# Patient Record
Sex: Female | Born: 1940 | Race: White | Hispanic: No | Marital: Married | Smoking: Never smoker
Health system: Southern US, Community
[De-identification: ages and names within clinical notes are randomized; demographics above are authoritative.]

## PROBLEM LIST (undated history)

## (undated) DIAGNOSIS — I10 Essential (primary) hypertension: Secondary | ICD-10-CM

## (undated) DIAGNOSIS — K219 Gastro-esophageal reflux disease without esophagitis: Secondary | ICD-10-CM

## (undated) DIAGNOSIS — M722 Plantar fascial fibromatosis: Secondary | ICD-10-CM

## (undated) DIAGNOSIS — M1990 Unspecified osteoarthritis, unspecified site: Secondary | ICD-10-CM

## (undated) DIAGNOSIS — I639 Cerebral infarction, unspecified: Secondary | ICD-10-CM

## (undated) DIAGNOSIS — E785 Hyperlipidemia, unspecified: Secondary | ICD-10-CM

## (undated) HISTORY — PX: CHOLECYSTECTOMY: SHX55

## (undated) HISTORY — DX: Hyperlipidemia, unspecified: E78.5

## (undated) HISTORY — PX: BREAST SURGERY: SHX581

## (undated) HISTORY — PX: EYE SURGERY: SHX253

---

## 1998-03-16 DIAGNOSIS — I639 Cerebral infarction, unspecified: Secondary | ICD-10-CM

## 1998-03-16 HISTORY — DX: Cerebral infarction, unspecified: I63.9

## 2004-05-30 ENCOUNTER — Emergency Department (HOSPITAL_COMMUNITY): Admission: EM | Admit: 2004-05-30 | Discharge: 2004-05-30 | Payer: Self-pay | Admitting: Family Medicine

## 2006-11-09 ENCOUNTER — Ambulatory Visit: Payer: Self-pay | Admitting: Internal Medicine

## 2006-12-22 ENCOUNTER — Ambulatory Visit: Payer: Self-pay | Admitting: Internal Medicine

## 2007-03-31 ENCOUNTER — Encounter: Admission: RE | Admit: 2007-03-31 | Discharge: 2007-03-31 | Payer: Self-pay | Admitting: Internal Medicine

## 2007-06-16 DIAGNOSIS — I10 Essential (primary) hypertension: Secondary | ICD-10-CM | POA: Insufficient documentation

## 2007-06-16 DIAGNOSIS — F411 Generalized anxiety disorder: Secondary | ICD-10-CM | POA: Insufficient documentation

## 2007-06-16 DIAGNOSIS — M129 Arthropathy, unspecified: Secondary | ICD-10-CM | POA: Insufficient documentation

## 2007-06-16 DIAGNOSIS — K649 Unspecified hemorrhoids: Secondary | ICD-10-CM | POA: Insufficient documentation

## 2007-06-16 DIAGNOSIS — E785 Hyperlipidemia, unspecified: Secondary | ICD-10-CM | POA: Insufficient documentation

## 2007-06-16 DIAGNOSIS — K5730 Diverticulosis of large intestine without perforation or abscess without bleeding: Secondary | ICD-10-CM | POA: Insufficient documentation

## 2007-06-16 DIAGNOSIS — Z8679 Personal history of other diseases of the circulatory system: Secondary | ICD-10-CM | POA: Insufficient documentation

## 2007-11-17 ENCOUNTER — Encounter: Admission: RE | Admit: 2007-11-17 | Discharge: 2007-11-17 | Payer: Self-pay | Admitting: Internal Medicine

## 2009-04-17 DIAGNOSIS — E663 Overweight: Secondary | ICD-10-CM | POA: Insufficient documentation

## 2009-04-17 DIAGNOSIS — N6019 Diffuse cystic mastopathy of unspecified breast: Secondary | ICD-10-CM | POA: Insufficient documentation

## 2009-04-17 DIAGNOSIS — J309 Allergic rhinitis, unspecified: Secondary | ICD-10-CM | POA: Insufficient documentation

## 2009-04-17 DIAGNOSIS — R339 Retention of urine, unspecified: Secondary | ICD-10-CM | POA: Insufficient documentation

## 2009-04-17 DIAGNOSIS — E669 Obesity, unspecified: Secondary | ICD-10-CM | POA: Insufficient documentation

## 2009-11-22 DIAGNOSIS — R209 Unspecified disturbances of skin sensation: Secondary | ICD-10-CM | POA: Insufficient documentation

## 2010-02-19 ENCOUNTER — Telehealth (INDEPENDENT_AMBULATORY_CARE_PROVIDER_SITE_OTHER): Payer: Self-pay | Admitting: *Deleted

## 2010-02-26 ENCOUNTER — Encounter (INDEPENDENT_AMBULATORY_CARE_PROVIDER_SITE_OTHER): Payer: Self-pay | Admitting: Neurology

## 2010-02-26 ENCOUNTER — Ambulatory Visit: Payer: Self-pay

## 2010-02-26 ENCOUNTER — Ambulatory Visit (HOSPITAL_COMMUNITY)
Admission: RE | Admit: 2010-02-26 | Discharge: 2010-02-26 | Payer: Self-pay | Source: Home / Self Care | Attending: Neurology | Admitting: Neurology

## 2010-02-26 ENCOUNTER — Encounter: Payer: Self-pay | Admitting: Cardiology

## 2010-04-15 NOTE — Progress Notes (Signed)
   Records received from [facility] for pt Referral gave to [facility] [place]  February 19, 2010 10:23 AM

## 2010-07-29 NOTE — Assessment & Plan Note (Signed)
[facility]                         GASTROENTEROLOGY OFFICE NOTE   Lisa Porter, Lisa Porter                          MRN:          [id number]  DATE:11/09/2006                            DOB:          1940-07-17    HISTORY OF PRESENT ILLNESS:  Lisa Porter is a very nice 70 year old white  female who is here to discuss colonoscopy.  She is essentially  asymptomatic, having bowel movements on a regular basis.  She had two  flexible sigmoidoscopies while living in [place] in 1991 and 1995, and  one in 2002, both showing diverticulosis.  There is no family history of  colon cancer.  She was also told she had hemorrhoids on her last  sigmoidoscopy.  She denies rectal bleeding.  Recent stool hemoccult  cards by Dr. Timothy Lasso have been returned, but the patient has not heard  from Dr. Timothy Lasso yet.  Another problem has been occasional dysphagia,  especially when she eats bread.  It is quite inconsistent.  She denies  any dysphagia from liquids.  She has not ever had any regurgitation of  her food.  On several occasions, she woke up at 4:00 a.m. with heartburn  which went away after she either took Gas-X or a drink of water.  She  has never taken any acid suppressing agents, and actually denies actual  heartburn during the day.  In 2000, the patient had stroke and was put  on Plavix.  She went off Plavix in 2005 for breast biopsies.   MEDICATIONS:  1. Plavix 75 mg p.o. daily.  2. Aspirin 81 mg p.o. daily.  3. Lipitor 40 mg p.o. daily.  4. Accupril 40 mg p.o. daily.  5. Atretic 20 mg p.o. daily.  6. Multivitamin.  7. Calcium.  8. Potassium.   PAST MEDICAL HISTORY:  1. High blood pressure.  2. Hyperlipidemia.  3. History of CVA.  4. Arthritis.  5. Anxiety.   OPERATIONS:  1. Laparoscopic cholecystectomy in 1990.  2. Breast biopsies for fibrocystic disease x3.   FAMILY HISTORY:  Negative for colon cancer.  Alcoholism in father.  Heart disease in mother.   SOCIAL  HISTORY:  Married with two children.  She worked for [employer].  She  does not smoke and does not drink alcohol.   REVIEW OF SYSTEMS:  Positive for allergies.   PHYSICAL EXAMINATION:  VITAL SIGNS:  Blood pressure 128/80, pulse 80,  weight 193 pounds.  GENERAL:  She is alert, oriented, no acute distress.  LUNGS:  Clear to auscultation.  COR:  Normal S1, S2.  ABDOMEN:  Soft, mildly obese, nontender with normoactive bowel sounds.  Liver edge was at costal margin.  No focal tenderness.  RECTAL:  Not done.  EXTREMITIES:  Trace edema.   IMPRESSION:  53 . A 70 year old white female who is a good candidate for  screening colonoscopy.  She has never had full colonoscopic exam, but  had two previous flexible sigmoidoscopies which showed presence of  hemorrhoids and diverticulosis.  She is at low risk for colon cancer  because of no family history of it and no rectal  bleeding.  1. Occasional dysphagia, may be more suggestive of esophageal      dysmotility.  It seems to be quite infrequent, and it is improved      with posture which suggests that it could be related to      dysmotility.  2. The patient has been anticoagulated with aspirin and Plavix for      prior CVA in 2000.   PLAN:  1. Colonoscopy scheduled this week.  We are going to hold her Plavix      for 7 days prior to the colonoscopy, but continue on the aspirin      since the current guidelines for colonoscopy allow for the patient      to stay on aspirin.  2. I do not think she needs an endoscopy.  Her symptoms are really not      severe enough to warrant endoscopic procedure.  I have discussed      with the patient the colonoscopy prep as well as the conscious      sedation.  If we find a polyp, she will have to stay off Plavix for      additional five days.  If not, she will  be able to return to      Plavix immediately after the procedure.     Hedwig Morton. Juanda Chance, MD  Electronically Signed    DMB/MedQ  DD: 11/09/2006  DT:  11/09/2006  Job #: [id number]   cc:   Gwen Pounds, MD

## 2011-05-20 DIAGNOSIS — I10 Essential (primary) hypertension: Secondary | ICD-10-CM | POA: Diagnosis not present

## 2011-05-20 DIAGNOSIS — J309 Allergic rhinitis, unspecified: Secondary | ICD-10-CM | POA: Diagnosis not present

## 2011-05-20 DIAGNOSIS — R5381 Other malaise: Secondary | ICD-10-CM | POA: Diagnosis not present

## 2011-05-20 DIAGNOSIS — R5383 Other fatigue: Secondary | ICD-10-CM | POA: Insufficient documentation

## 2011-05-20 DIAGNOSIS — E785 Hyperlipidemia, unspecified: Secondary | ICD-10-CM | POA: Diagnosis not present

## 2011-05-25 DIAGNOSIS — M1710 Unilateral primary osteoarthritis, unspecified knee: Secondary | ICD-10-CM | POA: Diagnosis not present

## 2011-06-05 DIAGNOSIS — H348392 Tributary (branch) retinal vein occlusion, unspecified eye, stable: Secondary | ICD-10-CM | POA: Diagnosis not present

## 2011-06-10 DIAGNOSIS — Z1231 Encounter for screening mammogram for malignant neoplasm of breast: Secondary | ICD-10-CM | POA: Diagnosis not present

## 2011-06-23 DIAGNOSIS — M25569 Pain in unspecified knee: Secondary | ICD-10-CM | POA: Diagnosis not present

## 2011-06-23 DIAGNOSIS — M1710 Unilateral primary osteoarthritis, unspecified knee: Secondary | ICD-10-CM | POA: Diagnosis not present

## 2011-08-31 DIAGNOSIS — Z8679 Personal history of other diseases of the circulatory system: Secondary | ICD-10-CM | POA: Diagnosis not present

## 2011-08-31 DIAGNOSIS — R42 Dizziness and giddiness: Secondary | ICD-10-CM | POA: Diagnosis not present

## 2011-09-07 DIAGNOSIS — Z8679 Personal history of other diseases of the circulatory system: Secondary | ICD-10-CM | POA: Diagnosis not present

## 2011-09-07 DIAGNOSIS — R42 Dizziness and giddiness: Secondary | ICD-10-CM | POA: Diagnosis not present

## 2011-11-25 DIAGNOSIS — I10 Essential (primary) hypertension: Secondary | ICD-10-CM | POA: Diagnosis not present

## 2011-11-25 DIAGNOSIS — E785 Hyperlipidemia, unspecified: Secondary | ICD-10-CM | POA: Diagnosis not present

## 2011-12-03 DIAGNOSIS — Z1212 Encounter for screening for malignant neoplasm of rectum: Secondary | ICD-10-CM | POA: Diagnosis not present

## 2011-12-03 DIAGNOSIS — J309 Allergic rhinitis, unspecified: Secondary | ICD-10-CM | POA: Diagnosis not present

## 2011-12-03 DIAGNOSIS — Z23 Encounter for immunization: Secondary | ICD-10-CM | POA: Diagnosis not present

## 2011-12-03 DIAGNOSIS — Z0000 Encounter for general adult medical examination without abnormal findings: Secondary | ICD-10-CM | POA: Diagnosis not present

## 2011-12-03 DIAGNOSIS — I6789 Other cerebrovascular disease: Secondary | ICD-10-CM | POA: Diagnosis not present

## 2011-12-03 DIAGNOSIS — R1319 Other dysphagia: Secondary | ICD-10-CM | POA: Diagnosis not present

## 2012-01-18 DIAGNOSIS — M25569 Pain in unspecified knee: Secondary | ICD-10-CM | POA: Diagnosis not present

## 2012-01-18 DIAGNOSIS — M1710 Unilateral primary osteoarthritis, unspecified knee: Secondary | ICD-10-CM | POA: Diagnosis not present

## 2012-01-18 DIAGNOSIS — M76899 Other specified enthesopathies of unspecified lower limb, excluding foot: Secondary | ICD-10-CM | POA: Diagnosis not present

## 2012-03-02 DIAGNOSIS — R42 Dizziness and giddiness: Secondary | ICD-10-CM | POA: Diagnosis not present

## 2012-04-14 DIAGNOSIS — L821 Other seborrheic keratosis: Secondary | ICD-10-CM | POA: Diagnosis not present

## 2012-04-14 DIAGNOSIS — L570 Actinic keratosis: Secondary | ICD-10-CM | POA: Diagnosis not present

## 2012-05-23 DIAGNOSIS — E785 Hyperlipidemia, unspecified: Secondary | ICD-10-CM | POA: Diagnosis not present

## 2012-05-23 DIAGNOSIS — J309 Allergic rhinitis, unspecified: Secondary | ICD-10-CM | POA: Diagnosis not present

## 2012-05-23 DIAGNOSIS — I10 Essential (primary) hypertension: Secondary | ICD-10-CM | POA: Diagnosis not present

## 2012-05-23 DIAGNOSIS — I6789 Other cerebrovascular disease: Secondary | ICD-10-CM | POA: Diagnosis not present

## 2012-06-08 DIAGNOSIS — H2510 Age-related nuclear cataract, unspecified eye: Secondary | ICD-10-CM | POA: Diagnosis not present

## 2012-06-13 DIAGNOSIS — Z1231 Encounter for screening mammogram for malignant neoplasm of breast: Secondary | ICD-10-CM | POA: Diagnosis not present

## 2012-06-29 DIAGNOSIS — M1710 Unilateral primary osteoarthritis, unspecified knee: Secondary | ICD-10-CM | POA: Diagnosis not present

## 2012-06-29 DIAGNOSIS — M67919 Unspecified disorder of synovium and tendon, unspecified shoulder: Secondary | ICD-10-CM | POA: Diagnosis not present

## 2012-06-29 DIAGNOSIS — M719 Bursopathy, unspecified: Secondary | ICD-10-CM | POA: Diagnosis not present

## 2012-07-18 ENCOUNTER — Ambulatory Visit: Payer: Self-pay | Admitting: Ophthalmology

## 2012-07-18 DIAGNOSIS — Z01812 Encounter for preprocedural laboratory examination: Secondary | ICD-10-CM | POA: Diagnosis not present

## 2012-07-18 DIAGNOSIS — H2510 Age-related nuclear cataract, unspecified eye: Secondary | ICD-10-CM | POA: Diagnosis not present

## 2012-07-18 DIAGNOSIS — Z01810 Encounter for preprocedural cardiovascular examination: Secondary | ICD-10-CM | POA: Diagnosis not present

## 2012-07-18 DIAGNOSIS — I10 Essential (primary) hypertension: Secondary | ICD-10-CM

## 2012-07-18 LAB — POTASSIUM: Potassium: 3.7 mmol/L (ref 3.5–5.1)

## 2012-07-27 DIAGNOSIS — M1710 Unilateral primary osteoarthritis, unspecified knee: Secondary | ICD-10-CM | POA: Diagnosis not present

## 2012-08-01 ENCOUNTER — Ambulatory Visit: Payer: Self-pay | Admitting: Ophthalmology

## 2012-08-01 DIAGNOSIS — I10 Essential (primary) hypertension: Secondary | ICD-10-CM | POA: Diagnosis not present

## 2012-08-01 DIAGNOSIS — H269 Unspecified cataract: Secondary | ICD-10-CM | POA: Diagnosis not present

## 2012-08-01 DIAGNOSIS — Z882 Allergy status to sulfonamides status: Secondary | ICD-10-CM | POA: Diagnosis not present

## 2012-08-01 DIAGNOSIS — M779 Enthesopathy, unspecified: Secondary | ICD-10-CM | POA: Diagnosis not present

## 2012-08-01 DIAGNOSIS — H2510 Age-related nuclear cataract, unspecified eye: Secondary | ICD-10-CM | POA: Diagnosis not present

## 2012-08-01 DIAGNOSIS — Z7901 Long term (current) use of anticoagulants: Secondary | ICD-10-CM | POA: Diagnosis not present

## 2012-08-01 DIAGNOSIS — E7800 Pure hypercholesterolemia, unspecified: Secondary | ICD-10-CM | POA: Diagnosis not present

## 2012-08-01 DIAGNOSIS — Z8673 Personal history of transient ischemic attack (TIA), and cerebral infarction without residual deficits: Secondary | ICD-10-CM | POA: Diagnosis not present

## 2012-08-01 DIAGNOSIS — Z79899 Other long term (current) drug therapy: Secondary | ICD-10-CM | POA: Diagnosis not present

## 2012-08-01 DIAGNOSIS — Z7982 Long term (current) use of aspirin: Secondary | ICD-10-CM | POA: Diagnosis not present

## 2012-11-22 ENCOUNTER — Other Ambulatory Visit (HOSPITAL_COMMUNITY): Payer: Self-pay | Admitting: Orthopaedic Surgery

## 2012-12-02 DIAGNOSIS — I10 Essential (primary) hypertension: Secondary | ICD-10-CM | POA: Diagnosis not present

## 2012-12-02 DIAGNOSIS — E785 Hyperlipidemia, unspecified: Secondary | ICD-10-CM | POA: Diagnosis not present

## 2012-12-12 DIAGNOSIS — J309 Allergic rhinitis, unspecified: Secondary | ICD-10-CM | POA: Diagnosis not present

## 2012-12-12 DIAGNOSIS — Z1212 Encounter for screening for malignant neoplasm of rectum: Secondary | ICD-10-CM | POA: Diagnosis not present

## 2012-12-12 DIAGNOSIS — Z23 Encounter for immunization: Secondary | ICD-10-CM | POA: Diagnosis not present

## 2012-12-12 DIAGNOSIS — R339 Retention of urine, unspecified: Secondary | ICD-10-CM | POA: Diagnosis not present

## 2012-12-12 DIAGNOSIS — M1990 Unspecified osteoarthritis, unspecified site: Secondary | ICD-10-CM | POA: Diagnosis not present

## 2012-12-12 DIAGNOSIS — R1319 Other dysphagia: Secondary | ICD-10-CM | POA: Diagnosis not present

## 2012-12-12 DIAGNOSIS — Z0000 Encounter for general adult medical examination without abnormal findings: Secondary | ICD-10-CM | POA: Diagnosis not present

## 2012-12-12 DIAGNOSIS — Z1331 Encounter for screening for depression: Secondary | ICD-10-CM | POA: Diagnosis not present

## 2012-12-12 DIAGNOSIS — E663 Overweight: Secondary | ICD-10-CM | POA: Diagnosis not present

## 2012-12-12 DIAGNOSIS — R51 Headache: Secondary | ICD-10-CM | POA: Diagnosis not present

## 2012-12-12 DIAGNOSIS — I6789 Other cerebrovascular disease: Secondary | ICD-10-CM | POA: Diagnosis not present

## 2012-12-12 DIAGNOSIS — R739 Hyperglycemia, unspecified: Secondary | ICD-10-CM | POA: Insufficient documentation

## 2012-12-13 DIAGNOSIS — L723 Sebaceous cyst: Secondary | ICD-10-CM | POA: Diagnosis not present

## 2012-12-13 DIAGNOSIS — L821 Other seborrheic keratosis: Secondary | ICD-10-CM | POA: Diagnosis not present

## 2012-12-13 DIAGNOSIS — L570 Actinic keratosis: Secondary | ICD-10-CM | POA: Diagnosis not present

## 2012-12-21 DIAGNOSIS — M1710 Unilateral primary osteoarthritis, unspecified knee: Secondary | ICD-10-CM | POA: Diagnosis not present

## 2013-01-03 DIAGNOSIS — Z23 Encounter for immunization: Secondary | ICD-10-CM | POA: Diagnosis not present

## 2013-01-11 DIAGNOSIS — H2510 Age-related nuclear cataract, unspecified eye: Secondary | ICD-10-CM | POA: Diagnosis not present

## 2013-01-13 ENCOUNTER — Encounter (HOSPITAL_COMMUNITY): Payer: Self-pay | Admitting: Pharmacy Technician

## 2013-01-17 ENCOUNTER — Other Ambulatory Visit (HOSPITAL_COMMUNITY): Payer: Self-pay | Admitting: Orthopaedic Surgery

## 2013-01-17 NOTE — Patient Instructions (Addendum)
20 Lashelle Koy  01/17/2013   Your procedure is scheduled on: 01/27/13  FRIDAY   Report to [facility] at  0515     AM.  Call this number if you have problems the morning of surgery: [phone]       Remember:   Do not eat food  Or drink :After Midnight. Thursday NIGHT   Take these medicines the morning of surgery with A SIP OF WATER:NONE   .  Contacts, dentures or partial plates can not be worn to surgery  Leave suitcase in the car. After surgery it may be brought to your room.  For patients admitted to the hospital, checkout time is 11:00 AM day of  discharge.             SPECIAL INSTRUCTIONS- SEE [place] PREPARING FOR SURGERY INSTRUCTION SHEET-     DO NOT WEAR JEWELRY, LOTIONS, POWDERS, OR PERFUMES.  WOMEN-- DO NOT SHAVE LEGS OR UNDERARMS FOR 12 HOURS BEFORE SHOWERS. MEN MAY SHAVE FACE.  Patients discharged the day of surgery will not be allowed to drive home. IF going home the day of surgery, you must have a driver and someone to stay with you for the first 24 hours  Name and phone number of your driver:   ADMISSION                                                                     Please read over the following fact sheets that you were given: MRSA Information,, Blood Transfusion Sheet  Information                                                                                 I am aware that blood will need to be drawn morning of surgery for type and screen  Zakiah Gauthreaux  PST [phone]                 FAILURE TO FOLLOW THESE INSTRUCTIONS MAY RESULT IN  CANCELLATION   OF YOUR SURGERY                                                  Patient Signature _____________________________

## 2013-01-17 NOTE — Progress Notes (Signed)
EKG 5/14 chart

## 2013-01-18 ENCOUNTER — Encounter (HOSPITAL_COMMUNITY)
Admission: RE | Admit: 2013-01-18 | Discharge: 2013-01-18 | Disposition: A | Discharge status: Home / Self Care | Payer: Medicare Other | Source: Ambulatory Visit | Attending: Orthopaedic Surgery | Admitting: Orthopaedic Surgery

## 2013-01-18 ENCOUNTER — Ambulatory Visit (HOSPITAL_COMMUNITY)
Admission: RE | Admit: 2013-01-18 | Discharge: 2013-01-18 | Disposition: A | Discharge status: Home / Self Care | Payer: Medicare Other | Source: Ambulatory Visit | Attending: Orthopaedic Surgery | Admitting: Orthopaedic Surgery

## 2013-01-18 ENCOUNTER — Encounter (HOSPITAL_COMMUNITY): Payer: Self-pay

## 2013-01-18 DIAGNOSIS — M47814 Spondylosis without myelopathy or radiculopathy, thoracic region: Secondary | ICD-10-CM | POA: Insufficient documentation

## 2013-01-18 DIAGNOSIS — Z01812 Encounter for preprocedural laboratory examination: Secondary | ICD-10-CM | POA: Insufficient documentation

## 2013-01-18 DIAGNOSIS — Z01818 Encounter for other preprocedural examination: Secondary | ICD-10-CM | POA: Diagnosis not present

## 2013-01-18 HISTORY — DX: Cerebral infarction, unspecified: I63.9

## 2013-01-18 HISTORY — DX: Essential (primary) hypertension: I10

## 2013-01-18 HISTORY — DX: Gastro-esophageal reflux disease without esophagitis: K21.9

## 2013-01-18 HISTORY — DX: Plantar fascial fibromatosis: M72.2

## 2013-01-18 HISTORY — DX: Unspecified osteoarthritis, unspecified site: M19.90

## 2013-01-18 LAB — URINALYSIS, ROUTINE W REFLEX MICROSCOPIC
Bilirubin Urine: NEGATIVE
Glucose, UA: NEGATIVE mg/dL
Hgb urine dipstick: NEGATIVE
Ketones, ur: NEGATIVE mg/dL
Protein, ur: NEGATIVE mg/dL
Urobilinogen, UA: 0.2 mg/dL (ref 0.0–1.0)

## 2013-01-18 LAB — PROTIME-INR
INR: 0.94 (ref 0.00–1.49)
Prothrombin Time: 12.4 seconds (ref 11.6–15.2)

## 2013-01-18 LAB — CBC
HCT: 39.2 % (ref 36.0–46.0)
Hemoglobin: 13.5 g/dL (ref 12.0–15.0)
MCH: 32.7 pg (ref 26.0–34.0)
MCHC: 34.4 g/dL (ref 30.0–36.0)
RDW: 12.6 % (ref 11.5–15.5)

## 2013-01-18 LAB — SURGICAL PCR SCREEN
MRSA, PCR: NEGATIVE
Staphylococcus aureus: NEGATIVE

## 2013-01-18 LAB — BASIC METABOLIC PANEL
BUN: 19 mg/dL (ref 6–23)
Chloride: 103 mEq/L (ref 96–112)
Creatinine, Ser: 0.8 mg/dL (ref 0.50–1.10)
GFR calc Af Amer: 83 mL/min — ABNORMAL LOW (ref >90)
Glucose, Bld: 95 mg/dL (ref 70–99)
Potassium: 3.9 mEq/L (ref 3.5–5.1)

## 2013-01-18 LAB — APTT: aPTT: 27 seconds (ref 24–37)

## 2013-01-27 ENCOUNTER — Inpatient Hospital Stay (HOSPITAL_COMMUNITY): Payer: Medicare Other | Admitting: Anesthesiology

## 2013-01-27 ENCOUNTER — Inpatient Hospital Stay (HOSPITAL_COMMUNITY): Payer: Medicare Other

## 2013-01-27 ENCOUNTER — Encounter (HOSPITAL_COMMUNITY): Payer: Medicare Other | Admitting: Anesthesiology

## 2013-01-27 ENCOUNTER — Encounter (HOSPITAL_COMMUNITY): Payer: Self-pay | Admitting: Orthopedic Surgery

## 2013-01-27 ENCOUNTER — Inpatient Hospital Stay (HOSPITAL_COMMUNITY)
Admission: RE | Admit: 2013-01-27 | Discharge: 2013-01-30 | DRG: 470 | Disposition: A | Discharge status: Home Health | Payer: Medicare Other | Source: Ambulatory Visit | Attending: Orthopaedic Surgery | Admitting: Orthopaedic Surgery

## 2013-01-27 ENCOUNTER — Encounter (HOSPITAL_COMMUNITY)
Admission: RE | Disposition: A | Discharge status: Home Health | Payer: Self-pay | Source: Ambulatory Visit | Attending: Orthopaedic Surgery

## 2013-01-27 DIAGNOSIS — Z79899 Other long term (current) drug therapy: Secondary | ICD-10-CM | POA: Diagnosis not present

## 2013-01-27 DIAGNOSIS — I10 Essential (primary) hypertension: Secondary | ICD-10-CM | POA: Diagnosis not present

## 2013-01-27 DIAGNOSIS — M1710 Unilateral primary osteoarthritis, unspecified knee: Secondary | ICD-10-CM | POA: Diagnosis not present

## 2013-01-27 DIAGNOSIS — Z8673 Personal history of transient ischemic attack (TIA), and cerebral infarction without residual deficits: Secondary | ICD-10-CM

## 2013-01-27 DIAGNOSIS — K219 Gastro-esophageal reflux disease without esophagitis: Secondary | ICD-10-CM | POA: Diagnosis present

## 2013-01-27 DIAGNOSIS — M25569 Pain in unspecified knee: Secondary | ICD-10-CM | POA: Diagnosis not present

## 2013-01-27 DIAGNOSIS — M1711 Unilateral primary osteoarthritis, right knee: Secondary | ICD-10-CM

## 2013-01-27 DIAGNOSIS — Z9089 Acquired absence of other organs: Secondary | ICD-10-CM

## 2013-01-27 DIAGNOSIS — Z7982 Long term (current) use of aspirin: Secondary | ICD-10-CM

## 2013-01-27 DIAGNOSIS — Z471 Aftercare following joint replacement surgery: Secondary | ICD-10-CM | POA: Diagnosis not present

## 2013-01-27 DIAGNOSIS — E785 Hyperlipidemia, unspecified: Secondary | ICD-10-CM | POA: Diagnosis not present

## 2013-01-27 DIAGNOSIS — Z881 Allergy status to other antibiotic agents status: Secondary | ICD-10-CM

## 2013-01-27 DIAGNOSIS — Z96659 Presence of unspecified artificial knee joint: Secondary | ICD-10-CM | POA: Diagnosis not present

## 2013-01-27 HISTORY — PX: TOTAL KNEE ARTHROPLASTY: SHX125

## 2013-01-27 LAB — TYPE AND SCREEN: Antibody Screen: NEGATIVE

## 2013-01-27 SURGERY — ARTHROPLASTY, KNEE, TOTAL
Anesthesia: Spinal | Site: Knee | Laterality: Right | Wound class: Clean

## 2013-01-27 MED ORDER — PROMETHAZINE HCL 25 MG/ML IJ SOLN
6.2500 mg | INTRAMUSCULAR | Status: DC | PRN
  Administered 2013-01-27: 6.25 mg via INTRAVENOUS

## 2013-01-27 MED ORDER — ACETAMINOPHEN 325 MG PO TABS
650.0000 mg | ORAL_TABLET | Freq: Four times a day (QID) | ORAL | Status: DC | PRN
  Administered 2013-01-28 – 2013-01-30 (×4): 650 mg via ORAL
  Filled 2013-01-27 (×4): qty 2

## 2013-01-27 MED ORDER — DIPHENHYDRAMINE HCL 12.5 MG/5ML PO ELIX: 12.5000 mg | ORAL_SOLUTION | ORAL | Status: DC | PRN

## 2013-01-27 MED ORDER — OXYCODONE HCL 5 MG PO TABS
5.0000 mg | ORAL_TABLET | ORAL | Status: DC | PRN
  Administered 2013-01-27: 5 mg via ORAL
  Administered 2013-01-27 (×2): 10 mg via ORAL
  Administered 2013-01-27: 5 mg via ORAL
  Administered 2013-01-28 (×3): 10 mg via ORAL
  Filled 2013-01-27: qty 2
  Filled 2013-01-27 (×2): qty 1
  Filled 2013-01-27 (×4): qty 2
  Filled 2013-01-27: qty 1

## 2013-01-27 MED ORDER — HYDROMORPHONE HCL PF 1 MG/ML IJ SOLN
1.0000 mg | INTRAMUSCULAR | Status: DC | PRN
  Administered 2013-01-27 – 2013-01-28 (×4): 1 mg via INTRAVENOUS
  Filled 2013-01-27 (×4): qty 1

## 2013-01-27 MED ORDER — SODIUM CHLORIDE 0.9 % IR SOLN
Status: DC | PRN
  Administered 2013-01-27: 1000 mL

## 2013-01-27 MED ORDER — SODIUM CHLORIDE 0.9 % IV SOLN
INTRAVENOUS | Status: DC
  Administered 2013-01-27: 14:00:00 75 mL/h via INTRAVENOUS
  Administered 2013-01-28: 04:00:00 via INTRAVENOUS

## 2013-01-27 MED ORDER — PROPOFOL 10 MG/ML IV BOLUS
INTRAVENOUS | Status: DC | PRN
  Administered 2013-01-27: 20 mg via INTRAVENOUS

## 2013-01-27 MED ORDER — ASPIRIN EC 81 MG PO TBEC
81.0000 mg | DELAYED_RELEASE_TABLET | Freq: Every day | ORAL | Status: DC
  Administered 2013-01-27 – 2013-01-29 (×3): 81 mg via ORAL
  Filled 2013-01-27 (×4): qty 1

## 2013-01-27 MED ORDER — FENTANYL CITRATE 0.05 MG/ML IJ SOLN: 25.0000 ug | INTRAMUSCULAR | Status: DC | PRN

## 2013-01-27 MED ORDER — ZOLPIDEM TARTRATE 5 MG PO TABS: 5.0000 mg | ORAL_TABLET | Freq: Every evening | ORAL | Status: DC | PRN

## 2013-01-27 MED ORDER — MENTHOL 3 MG MT LOZG: 1.0000 | LOZENGE | OROMUCOSAL | Status: DC | PRN

## 2013-01-27 MED ORDER — ONDANSETRON HCL 4 MG/2ML IJ SOLN
INTRAMUSCULAR | Status: DC | PRN
  Administered 2013-01-27: 4 mg via INTRAVENOUS

## 2013-01-27 MED ORDER — ALUM & MAG HYDROXIDE-SIMETH 200-200-20 MG/5ML PO SUSP: 30.0000 mL | ORAL | Status: DC | PRN

## 2013-01-27 MED ORDER — ONDANSETRON HCL 4 MG PO TABS
4.0000 mg | ORAL_TABLET | Freq: Four times a day (QID) | ORAL | Status: DC | PRN
  Administered 2013-01-30: 4 mg via ORAL
  Filled 2013-01-27: qty 1

## 2013-01-27 MED ORDER — DOCUSATE SODIUM 100 MG PO CAPS
100.0000 mg | ORAL_CAPSULE | Freq: Two times a day (BID) | ORAL | Status: DC
  Administered 2013-01-27 – 2013-01-30 (×6): 100 mg via ORAL

## 2013-01-27 MED ORDER — QUINAPRIL HCL 10 MG PO TABS: 40.0000 mg | ORAL_TABLET | Freq: Every day | ORAL | Status: DC

## 2013-01-27 MED ORDER — LISINOPRIL 20 MG PO TABS
20.0000 mg | ORAL_TABLET | Freq: Every day | ORAL | Status: DC
  Administered 2013-01-27 – 2013-01-30 (×4): 20 mg via ORAL
  Filled 2013-01-27 (×4): qty 1

## 2013-01-27 MED ORDER — PROPOFOL INFUSION 10 MG/ML OPTIME
INTRAVENOUS | Status: DC | PRN
  Administered 2013-01-27: 75 ug/kg/min via INTRAVENOUS

## 2013-01-27 MED ORDER — PROMETHAZINE HCL 25 MG/ML IJ SOLN
12.5000 mg | Freq: Four times a day (QID) | INTRAMUSCULAR | Status: DC | PRN
  Administered 2013-01-27: 12.5 mg via INTRAVENOUS
  Filled 2013-01-27: qty 1

## 2013-01-27 MED ORDER — LACTATED RINGERS IV SOLN
INTRAVENOUS | Status: DC | PRN
  Administered 2013-01-27 (×2): via INTRAVENOUS

## 2013-01-27 MED ORDER — HYDROCHLOROTHIAZIDE 12.5 MG PO CAPS
12.5000 mg | ORAL_CAPSULE | Freq: Every day | ORAL | Status: DC
  Administered 2013-01-27 – 2013-01-30 (×4): 12.5 mg via ORAL
  Filled 2013-01-27 (×4): qty 1

## 2013-01-27 MED ORDER — CEFAZOLIN SODIUM-DEXTROSE 2-3 GM-% IV SOLR
2.0000 g | INTRAVENOUS | Status: AC
  Administered 2013-01-27: 2 g via INTRAVENOUS

## 2013-01-27 MED ORDER — STERILE WATER FOR IRRIGATION IR SOLN
Status: DC | PRN
  Administered 2013-01-27: 3000 mL

## 2013-01-27 MED ORDER — CYANOCOBALAMIN 500 MCG PO TABS
500.0000 ug | ORAL_TABLET | Freq: Every morning | ORAL | Status: DC
  Administered 2013-01-27 – 2013-01-30 (×3): 500 ug via ORAL
  Filled 2013-01-27 (×4): qty 1

## 2013-01-27 MED ORDER — PHENOL 1.4 % MT LIQD: 1.0000 | OROMUCOSAL | Status: DC | PRN

## 2013-01-27 MED ORDER — OXYCODONE HCL ER 20 MG PO T12A
20.0000 mg | EXTENDED_RELEASE_TABLET | Freq: Two times a day (BID) | ORAL | Status: DC
  Administered 2013-01-27 – 2013-01-29 (×5): 20 mg via ORAL
  Filled 2013-01-27 (×5): qty 1

## 2013-01-27 MED ORDER — METOCLOPRAMIDE HCL 5 MG PO TABS
5.0000 mg | ORAL_TABLET | Freq: Three times a day (TID) | ORAL | Status: DC | PRN
  Administered 2013-01-28 – 2013-01-29 (×4): 10 mg via ORAL
  Filled 2013-01-27 (×4): qty 2

## 2013-01-27 MED ORDER — METOCLOPRAMIDE HCL 5 MG/ML IJ SOLN
5.0000 mg | Freq: Three times a day (TID) | INTRAMUSCULAR | Status: DC | PRN
  Administered 2013-01-27 – 2013-01-28 (×2): 10 mg via INTRAVENOUS
  Filled 2013-01-27 (×3): qty 2

## 2013-01-27 MED ORDER — LACTATED RINGERS IV SOLN: INTRAVENOUS | Status: DC

## 2013-01-27 MED ORDER — QUINAPRIL-HYDROCHLOROTHIAZIDE 20-12.5 MG PO TABS: 1.0000 | ORAL_TABLET | Freq: Every morning | ORAL | Status: DC

## 2013-01-27 MED ORDER — KETOROLAC TROMETHAMINE 15 MG/ML IJ SOLN
7.5000 mg | Freq: Once | INTRAMUSCULAR | Status: AC
  Administered 2013-01-27: 7.5 mg via INTRAVENOUS
  Filled 2013-01-27: qty 1

## 2013-01-27 MED ORDER — ATORVASTATIN CALCIUM 80 MG PO TABS
80.0000 mg | ORAL_TABLET | Freq: Every day | ORAL | Status: DC
  Administered 2013-01-28 – 2013-01-29 (×2): 80 mg via ORAL
  Filled 2013-01-27 (×4): qty 1

## 2013-01-27 MED ORDER — LISINOPRIL 40 MG PO TABS
40.0000 mg | ORAL_TABLET | Freq: Every day | ORAL | Status: DC
  Administered 2013-01-27 – 2013-01-29 (×3): 40 mg via ORAL
  Filled 2013-01-27 (×4): qty 1

## 2013-01-27 MED ORDER — MEPERIDINE HCL 50 MG/ML IJ SOLN
6.2500 mg | INTRAMUSCULAR | Status: DC | PRN
  Administered 2013-01-27: 6.25 mg via INTRAVENOUS

## 2013-01-27 MED ORDER — VITAMIN C 500 MG PO TABS
500.0000 mg | ORAL_TABLET | Freq: Two times a day (BID) | ORAL | Status: DC
  Administered 2013-01-28 – 2013-01-30 (×4): 500 mg via ORAL
  Filled 2013-01-27 (×7): qty 1

## 2013-01-27 MED ORDER — POLYETHYLENE GLYCOL 3350 17 G PO PACK
17.0000 g | PACK | Freq: Every day | ORAL | Status: DC | PRN
  Administered 2013-01-28 – 2013-01-29 (×3): 17 g via ORAL

## 2013-01-27 MED ORDER — 0.9 % SODIUM CHLORIDE (POUR BTL) OPTIME
TOPICAL | Status: DC | PRN
  Administered 2013-01-27: 1000 mL

## 2013-01-27 MED ORDER — PROMETHAZINE HCL 25 MG/ML IJ SOLN
INTRAMUSCULAR | Status: AC
  Administered 2013-01-27: 12.5 mg via INTRAVENOUS
  Filled 2013-01-27: qty 1

## 2013-01-27 MED ORDER — BUPIVACAINE IN DEXTROSE 0.75-8.25 % IT SOLN
INTRATHECAL | Status: DC | PRN
  Administered 2013-01-27: 1.6 mL via INTRATHECAL

## 2013-01-27 MED ORDER — MEPERIDINE HCL 50 MG/ML IJ SOLN
INTRAMUSCULAR | Status: AC
  Filled 2013-01-27: qty 1

## 2013-01-27 MED ORDER — ACETAMINOPHEN 650 MG RE SUPP: 650.0000 mg | Freq: Four times a day (QID) | RECTAL | Status: DC | PRN

## 2013-01-27 MED ORDER — GUAIFENESIN ER 600 MG PO TB12
1200.0000 mg | ORAL_TABLET | Freq: Two times a day (BID) | ORAL | Status: DC
  Administered 2013-01-28 – 2013-01-30 (×5): 1200 mg via ORAL
  Filled 2013-01-27 (×7): qty 2

## 2013-01-27 MED ORDER — METHOCARBAMOL 100 MG/ML IJ SOLN
500.0000 mg | Freq: Four times a day (QID) | INTRAVENOUS | Status: DC | PRN
  Administered 2013-01-27: 500 mg via INTRAVENOUS
  Filled 2013-01-27: qty 5

## 2013-01-27 MED ORDER — ONDANSETRON HCL 4 MG/2ML IJ SOLN
4.0000 mg | Freq: Four times a day (QID) | INTRAMUSCULAR | Status: DC | PRN
  Administered 2013-01-27 – 2013-01-28 (×2): 4 mg via INTRAVENOUS
  Filled 2013-01-27 (×2): qty 2

## 2013-01-27 MED ORDER — CEFAZOLIN SODIUM-DEXTROSE 2-3 GM-% IV SOLR
INTRAVENOUS | Status: AC
  Filled 2013-01-27: qty 50

## 2013-01-27 MED ORDER — ADULT MULTIVITAMIN W/MINERALS CH
1.0000 | ORAL_TABLET | Freq: Every day | ORAL | Status: DC
  Administered 2013-01-28 – 2013-01-29 (×2): 1 via ORAL
  Filled 2013-01-27 (×4): qty 1

## 2013-01-27 MED ORDER — FLUTICASONE PROPIONATE 50 MCG/ACT NA SUSP
1.0000 | Freq: Two times a day (BID) | NASAL | Status: DC
  Administered 2013-01-28 – 2013-01-29 (×3): 1 via NASAL
  Filled 2013-01-27: qty 16

## 2013-01-27 MED ORDER — METHOCARBAMOL 500 MG PO TABS
500.0000 mg | ORAL_TABLET | Freq: Four times a day (QID) | ORAL | Status: DC | PRN
  Administered 2013-01-27 – 2013-01-29 (×2): 500 mg via ORAL
  Filled 2013-01-27 (×2): qty 1

## 2013-01-27 MED ORDER — CLOPIDOGREL BISULFATE 75 MG PO TABS
75.0000 mg | ORAL_TABLET | Freq: Every day | ORAL | Status: DC
  Administered 2013-01-27 – 2013-01-29 (×3): 75 mg via ORAL
  Filled 2013-01-27 (×4): qty 1

## 2013-01-27 MED ORDER — CEFAZOLIN SODIUM 1-5 GM-% IV SOLN
1.0000 g | Freq: Four times a day (QID) | INTRAVENOUS | Status: AC
  Administered 2013-01-27 (×2): 1 g via INTRAVENOUS
  Filled 2013-01-27 (×2): qty 50

## 2013-01-27 MED ORDER — FENTANYL CITRATE 0.05 MG/ML IJ SOLN
INTRAMUSCULAR | Status: DC | PRN
  Administered 2013-01-27 (×4): 25 ug via INTRAVENOUS

## 2013-01-27 SURGICAL SUPPLY — 64 items
ADH SKN CLS APL DERMABOND .7 (GAUZE/BANDAGES/DRESSINGS)
BAG SPEC THK2 15X12 ZIP CLS (MISCELLANEOUS) ×1
BAG ZIPLOCK 12X15 (MISCELLANEOUS) ×2 IMPLANT
BANDAGE ELASTIC 6 VELCRO ST LF (GAUZE/BANDAGES/DRESSINGS) ×4 IMPLANT
BANDAGE ESMARK 6X9 LF (GAUZE/BANDAGES/DRESSINGS) ×1 IMPLANT
BLADE SAG 18X100X1.27 (BLADE) ×2 IMPLANT
BNDG CMPR 9X6 STRL LF SNTH (GAUZE/BANDAGES/DRESSINGS) ×1
BNDG ESMARK 6X9 LF (GAUZE/BANDAGES/DRESSINGS) ×2
BOWL SMART MIX CTS (DISPOSABLE) ×2 IMPLANT
CEMENT BONE 1-PACK (Cement) ×4 IMPLANT
CUFF TOURN SGL QUICK 34 (TOURNIQUET CUFF) ×2
CUFF TRNQT CYL 34X4X40X1 (TOURNIQUET CUFF) ×1 IMPLANT
DERMABOND ADVANCED (GAUZE/BANDAGES/DRESSINGS)
DERMABOND ADVANCED .7 DNX12 (GAUZE/BANDAGES/DRESSINGS) IMPLANT
DRAPE EXTREMITY T 121X128X90 (DRAPE) ×2 IMPLANT
DRAPE LG THREE QUARTER DISP (DRAPES) IMPLANT
DRAPE POUCH INSTRU U-SHP 10X18 (DRAPES) ×2 IMPLANT
DRAPE U-SHAPE 47X51 STRL (DRAPES) ×2 IMPLANT
DRSG AQUACEL AG ADV 3.5X10 (GAUZE/BANDAGES/DRESSINGS) ×2 IMPLANT
DRSG PAD ABDOMINAL 8X10 ST (GAUZE/BANDAGES/DRESSINGS) IMPLANT
DRSG TEGADERM 4X4.75 (GAUZE/BANDAGES/DRESSINGS) ×2 IMPLANT
DURAPREP 26ML APPLICATOR (WOUND CARE) ×4 IMPLANT
ELECT REM PT RETURN 9FT ADLT (ELECTROSURGICAL) ×2
ELECTRODE REM PT RTRN 9FT ADLT (ELECTROSURGICAL) ×1 IMPLANT
EVACUATOR 1/8 PVC DRAIN (DRAIN) ×2 IMPLANT
FACESHIELD LNG OPTICON STERILE (SAFETY) ×10 IMPLANT
GAUZE SPONGE 2X2 8PLY STRL LF (GAUZE/BANDAGES/DRESSINGS) IMPLANT
GAUZE XEROFORM 1X8 LF (GAUZE/BANDAGES/DRESSINGS) ×2 IMPLANT
GLOVE BIO SURGEON STRL SZ7.5 (GLOVE) ×2 IMPLANT
GLOVE BIOGEL PI IND STRL 8 (GLOVE) ×2 IMPLANT
GLOVE BIOGEL PI INDICATOR 8 (GLOVE) ×2
GLOVE ECLIPSE 8.0 STRL XLNG CF (GLOVE) ×2 IMPLANT
GOWN STRL REIN XL XLG (GOWN DISPOSABLE) ×4 IMPLANT
HANDPIECE INTERPULSE COAX TIP (DISPOSABLE) ×2
IMMOBILIZER KNEE 20 (SOFTGOODS) ×2
IMMOBILIZER KNEE 20 THIGH 36 (SOFTGOODS) ×1 IMPLANT
KIT BASIN OR (CUSTOM PROCEDURE TRAY) ×2 IMPLANT
KNEE/VIT E POLY LINER LEVEL 1B ×2 IMPLANT
NEEDLE HYPO 21X1.5 SAFETY (NEEDLE) IMPLANT
NS IRRIG 1000ML POUR BTL (IV SOLUTION) ×2 IMPLANT
PACK TOTAL JOINT (CUSTOM PROCEDURE TRAY) ×2 IMPLANT
PAD ABD 7.5X8 STRL (GAUZE/BANDAGES/DRESSINGS) ×2 IMPLANT
PADDING CAST COTTON 6X4 STRL (CAST SUPPLIES) ×4 IMPLANT
POSITIONER SURGICAL ARM (MISCELLANEOUS) ×2 IMPLANT
SET HNDPC FAN SPRY TIP SCT (DISPOSABLE) ×1 IMPLANT
SET PAD KNEE POSITIONER (MISCELLANEOUS) ×2 IMPLANT
SPONGE GAUZE 2X2 STER 10/PKG (GAUZE/BANDAGES/DRESSINGS)
SPONGE GAUZE 4X4 12PLY (GAUZE/BANDAGES/DRESSINGS) ×2 IMPLANT
STAPLER VISISTAT 35W (STAPLE) ×2 IMPLANT
SUCTION FRAZIER 12FR DISP (SUCTIONS) ×2 IMPLANT
SUT ETHILON 3 0 PS 1 (SUTURE) ×2 IMPLANT
SUT MNCRL AB 4-0 PS2 18 (SUTURE) IMPLANT
SUT VIC AB 0 CT1 27 (SUTURE)
SUT VIC AB 0 CT1 27XBRD ANTBC (SUTURE) IMPLANT
SUT VIC AB 1 CT1 27 (SUTURE) ×6
SUT VIC AB 1 CT1 27XBRD ANTBC (SUTURE) ×3 IMPLANT
SUT VIC AB 2-0 CT1 27 (SUTURE) ×4
SUT VIC AB 2-0 CT1 TAPERPNT 27 (SUTURE) ×2 IMPLANT
SYR 50ML LL SCALE MARK (SYRINGE) IMPLANT
TOWEL OR 17X26 10 PK STRL BLUE (TOWEL DISPOSABLE) ×2 IMPLANT
TOWEL OR NON WOVEN STRL DISP B (DISPOSABLE) ×2 IMPLANT
TRAY FOLEY CATH 14FRSI W/METER (CATHETERS) ×2 IMPLANT
WATER STERILE IRR 1500ML POUR (IV SOLUTION) ×4 IMPLANT
WRAP KNEE MAXI GEL POST OP (GAUZE/BANDAGES/DRESSINGS) ×2 IMPLANT

## 2013-01-27 NOTE — Anesthesia Procedure Notes (Signed)
Spinal  Patient location during procedure: OR Staffing Anesthesiologist: Tecumseh Yeagley Performed by: anesthesiologist  Preanesthetic Checklist Completed: patient identified, site marked, surgical consent, pre-op evaluation, timeout performed, IV checked, risks and benefits discussed and monitors and equipment checked Spinal Block Patient position: sitting Prep: Betadine Patient monitoring: heart rate, continuous pulse ox and blood pressure Approach: right paramedian Location: L3-4 Injection technique: single-shot Needle Needle type: Sprotte  Needle gauge: 24 G Needle length: 9 cm Additional Notes Expiration date of kit checked and confirmed. Patient tolerated procedure well, without complications.     

## 2013-01-27 NOTE — Anesthesia Postprocedure Evaluation (Signed)
  Anesthesia Post-op Note  [name]  Procedure(s) Performed: Procedure(s) (LRB): RIGHT TOTAL KNEE ARTHROPLASTY (Right)  Patient Location: PACU  Anesthesia Type: Spinal  Level of Consciousness: awake and alert   Airway and Oxygen Therapy: Patient Spontanous Breathing  Post-op Pain: mild  Post-op Assessment: Post-op Vital signs reviewed, Patient's Cardiovascular Status Stable, Respiratory Function Stable, Patent Airway and No signs of Nausea or vomiting  Last Vitals:  Filed Vitals:   01/27/13 1245  BP: 178/73  Pulse: 69  Temp: 36.5 C  Resp: 16    Post-op Vital Signs: stable   Complications: No apparent anesthesia complications

## 2013-01-27 NOTE — Brief Op Note (Signed)
01/27/2013  9:25 AM  PATIENT:  Lisa Porter  72 y.o. female  PRE-OPERATIVE DIAGNOSIS:  Severe arthritis right knee  POST-OPERATIVE DIAGNOSIS:  Severe arthritis right knee  PROCEDURE:  Procedure(s): RIGHT TOTAL KNEE ARTHROPLASTY (Right)  SURGEON:  Surgeon(s) and Role:    * Kathryne Hitch, MD - Primary  PHYSICIAN ASSISTANT: Rexene Edison, PA-C  ANESTHESIA:   spinal  EBL:  Total I/O In: 1400 [I.V.:1400] Out: 400 [Urine:300; Blood:100]  BLOOD ADMINISTERED:none  DRAINS: none   LOCAL MEDICATIONS USED:  NONE  SPECIMEN:  No Specimen  DISPOSITION OF SPECIMEN:  N/A  COUNTS:  YES  TOURNIQUET:   Total Tourniquet Time Documented: Thigh (Right) - 66 minutes Total: Thigh (Right) - 66 minutes   DICTATION: .Other Dictation: Dictation Number [id number]  PLAN OF CARE: Admit to inpatient   PATIENT DISPOSITION:  PACU - hemodynamically stable.   Delay start of Pharmacological VTE agent (>24hrs) due to surgical blood loss or risk of bleeding: no

## 2013-01-27 NOTE — Preoperative (Signed)
Beta Blockers   Reason not to administer Beta Blockers:Not Applicable 

## 2013-01-27 NOTE — Transfer of Care (Signed)
Immediate Anesthesia Transfer of Care Note  [name]  Procedure(s) Performed: Procedure(s): RIGHT TOTAL KNEE ARTHROPLASTY (Right)  Patient Location: PACU  Anesthesia Type:Spinal  Level of Consciousness: awake, alert , oriented and patient cooperative  Airway & Oxygen Therapy: Patient Spontanous Breathing and Patient connected to face mask oxygen  Post-op Assessment: Report given to PACU RN and Post -op Vital signs reviewed and stable  Post vital signs: Reviewed and stable  Complications: No apparent anesthesia complications

## 2013-01-27 NOTE — Progress Notes (Signed)
Physical Therapy Evaluation Patient Details Name: Lisa Porter MRN: [id number] DOB: 11-04-1940 Today's Date: 01/27/2013 Time: 9629-5284 PT Time Calculation (min): 37 min  PT Assessment / Plan / Recommendation History of Present Illness     Clinical Impression  Pt s/p R TKR presents with decreased R LE strength/ROM and post op pain limiting functional mobility.  Pt should progress to d/c home with family assist and HHPT follow up.    PT Assessment  Patient needs continued PT services    Follow Up Recommendations  Home health PT    Does the patient have the potential to tolerate intense rehabilitation      Barriers to Discharge        Equipment Recommendations  None recommended by PT    Recommendations for Other Services OT consult   Frequency 7X/week    Precautions / Restrictions Precautions Precautions: Knee Required Braces or Orthoses: Knee Immobilizer - Right Knee Immobilizer - Right: Discontinue once straight leg raise with < 10 degree lag Restrictions Weight Bearing Restrictions: No Other Position/Activity Restrictions: WBAT   Pertinent Vitals/Pain 7/10; premed, ice packs provided      Mobility  Bed Mobility Bed Mobility: Supine to Sit Supine to Sit: 1: +2 Total assist Supine to Sit: Patient Percentage: 60% Details for Bed Mobility Assistance: cues for sequence and use of L LE to self assist Transfers Transfers: Sit to Stand;Stand to Sit Sit to Stand: 1: +2 Total assist;From bed;With upper extremity assist Sit to Stand: Patient Percentage: 70% Stand to Sit: 1: +2 Total assist;With upper extremity assist;With armrests;To chair/3-in-1 Stand to Sit: Patient Percentage: 60% Details for Transfer Assistance: cues for LE management and use of UEs to self assist Ambulation/Gait Ambulation/Gait Assistance: 1: +2 Total assist Ambulation/Gait: Patient Percentage: 70% Ambulation Distance (Feet): 9 Feet Assistive device: Rolling walker Ambulation/Gait Assistance  Details: cues for sequence, posture, position from RW Gait Pattern: Step-to pattern;Decreased step length - right;Decreased step length - left;Shuffle;Trunk flexed    Exercises Total Joint Exercises Ankle Circles/Pumps: AROM;10 reps;Supine;Both Quad Sets: AROM;Both;10 reps;Supine Straight Leg Raises: AAROM;10 reps;Supine;Right   PT Diagnosis: Difficulty walking  PT Problem List: Decreased strength;Decreased range of motion;Decreased activity tolerance;Decreased mobility;Decreased knowledge of use of DME;Pain;Decreased knowledge of precautions PT Treatment Interventions: DME instruction;Gait training;Stair training;Functional mobility training;Therapeutic activities;Therapeutic exercise;Patient/family education     PT Goals(Current goals can be found in the care plan section) Acute Rehab PT Goals Patient Stated Goal: Resume previous lifestyle with decreased pain PT Goal Formulation: With patient Time For Goal Achievement: 02/03/13 Potential to Achieve Goals: Good  Visit Information  Last PT Received On: 01/27/13 Assistance Needed: +2       Prior Functioning  Home Living Family/patient expects to be discharged to:: Private residence Living Arrangements: Spouse/significant other Available Help at Discharge: Family Type of Home: House Home Access: Stairs to enter Secretary/administrator of Steps: 3+3 Entrance Stairs-Rails: Left Home Layout: One level Home Equipment: Environmental consultant - 2 wheels;Cane - single point Prior Function Level of Independence: Independent Communication Communication: No difficulties Dominant Hand: Right    Cognition  Cognition Arousal/Alertness: Awake/alert Behavior During Therapy: WFL for tasks assessed/performed Overall Cognitive Status: Within Functional Limits for tasks assessed    Extremity/Trunk Assessment Upper Extremity Assessment Upper Extremity Assessment: Overall WFL for tasks assessed Lower Extremity Assessment Lower Extremity Assessment: RLE  deficits/detail RLE Deficits / Details: 2/5 quads; ROM NT 2* 7/10 pain RLE: Unable to fully assess due to pain   Balance    End of Session PT - End of Session  Equipment Utilized During Treatment: Gait belt;Right knee immobilizer Activity Tolerance: Other (comment) (N&V) Patient left: in chair;with call bell/phone within reach;with family/visitor present Nurse Communication: Mobility status  GP     Brigham Cobbins 01/27/2013, 5:20 PM

## 2013-01-27 NOTE — H&P (Signed)
TOTAL KNEE ADMISSION H&P  Patient is being admitted for right total knee arthroplasty.  Subjective:  Chief Complaint:right knee pain.  HPI: Lisa Porter, 72 y.o. female, has a history of pain and functional disability in the right knee due to arthritis and has failed non-surgical conservative treatments for greater than 12 weeks to includeNSAID's and/or analgesics, corticosteriod injections, use of assistive devices, weight reduction as appropriate and activity modification.  Onset of symptoms was gradual, starting 4 years ago with gradually worsening course since that time. The patient noted no past surgery on the right knee(s).  Patient currently rates pain in the right knee(s) at 8 out of 10 with activity. Patient has night pain, worsening of pain with activity and weight bearing, pain that interferes with activities of daily living, pain with passive range of motion, crepitus and joint swelling.  Patient has evidence of subchondral sclerosis, periarticular osteophytes and joint space narrowing by imaging studies. There is no active infection.  The patient has received pre-operative education about total joint replacements.  Patient Active Problem List   Diagnosis Date Noted  . Arthritis of knee, right 01/27/2013  . HYPERLIPIDEMIA 06/16/2007  . ANXIETY 06/16/2007  . HYPERTENSION 06/16/2007  . HEMORRHOIDS 06/16/2007  . DIVERTICULOSIS, COLON 06/16/2007  . ARTHRITIS 06/16/2007  . CEREBROVASCULAR ACCIDENT, HX OF 06/16/2007   Past Medical History  Diagnosis Date  . Hypertension   . Stroke 2000    no defecits/  TIA  2012  . GERD (gastroesophageal reflux disease)   . Arthritis   . Plantar fasciitis of right foot     Past Surgical History  Procedure Laterality Date  . Breast surgery Right     biopsy x 3  . Eye surgery Left     cataract extraction with IOL  . Cholecystectomy      Prescriptions prior to admission  Medication Sig Dispense Refill  . acetaminophen (TYLENOL ARTHRITIS  PAIN) 650 MG CR tablet Take 1,300 mg by mouth at bedtime.      Marland Kitchen acetaminophen (TYLENOL) 500 MG tablet Take 500 mg by mouth every morning.      Marland Kitchen aspirin EC 81 MG tablet Take 81 mg by mouth at bedtime.      Marland Kitchen atorvastatin (LIPITOR) 80 MG tablet Take 80 mg by mouth at bedtime.      . Calcium Carb-Cholecalciferol (CALCIUM 600 + D PO) Take 1 tablet by mouth every morning.      . fluticasone (FLONASE) 50 MCG/ACT nasal spray Place 1 spray into the nose 2 (two) times daily.      Marland Kitchen guaiFENesin (MUCINEX) 600 MG 12 hr tablet Take 1,200 mg by mouth 2 (two) times daily.      . Multiple Vitamin (MULTIVITAMIN WITH MINERALS) TABS tablet Take 1 tablet by mouth at bedtime.      . quinapril (ACCUPRIL) 40 MG tablet Take 40 mg by mouth at bedtime.      . quinapril-hydrochlorothiazide (ACCURETIC) 20-12.5 MG per tablet Take 1 tablet by mouth every morning.      . vitamin B-12 (CYANOCOBALAMIN) 500 MCG tablet Take 500 mcg by mouth every morning.      . vitamin C (ASCORBIC ACID) 500 MG tablet Take 500 mg by mouth 2 (two) times daily.      . clopidogrel (PLAVIX) 75 MG tablet Take 75 mg by mouth at bedtime. States Will stop 01/20/13      . OVER THE COUNTER MEDICATION Take 1 tablet by mouth 2 (two) times daily. Glucosamine Chondroitin 1500mg /1200mg   Allergies  Allergen Reactions  . Tape Other (See Comments)    Bandaids - causes rash and redness  . Sulfa Antibiotics Rash    History  Substance Use Topics  . Smoking status: Never Smoker   . Smokeless tobacco: Never Used  . Alcohol Use: No    No family history on file.   Review of Systems  Musculoskeletal: Positive for joint pain.  All other systems reviewed and are negative.    Objective:  Physical Exam  Constitutional: She is oriented to person, place, and time. She appears well-developed and well-nourished.  HENT:  Head: Normocephalic and atraumatic.  Eyes: EOM are normal. Pupils are equal, round, and reactive to light.  Neck: Normal range of  motion. Neck supple.  Cardiovascular: Normal rate and regular rhythm.   Respiratory: Effort normal and breath sounds normal.  GI: Soft. Bowel sounds are normal.  Musculoskeletal:       Right knee: She exhibits decreased range of motion, effusion and bony tenderness. Tenderness found. Medial joint line and lateral joint line tenderness noted.  Neurological: She is alert and oriented to person, place, and time.  Skin: Skin is warm and dry.  Psychiatric: She has a normal mood and affect.    Vital signs in last 24 hours: Temp:  [97.4 F (36.3 C)] 97.4 F (36.3 C) (11/14 0524) Pulse Rate:  [75] 75 (11/14 0524) Resp:  [16] 16 (11/14 0524) BP: (154)/(69) 154/69 mmHg (11/14 0524) SpO2:  [99 %] 99 % (11/14 0524)  Labs:   There is no weight on file to calculate BMI.   Imaging Review Plain radiographs demonstrate severe degenerative joint disease of the right knee(s). The overall alignment ismild varus. The bone quality appears to be good for age and reported activity level.  Assessment/Plan:  End stage arthritis, right knee   The patient history, physical examination, clinical judgment of the provider and imaging studies are consistent with end stage degenerative joint disease of the right knee(s) and total knee arthroplasty is deemed medically necessary. The treatment options including medical management, injection therapy arthroscopy and arthroplasty were discussed at length. The risks and benefits of total knee arthroplasty were presented and reviewed. The risks due to aseptic loosening, infection, stiffness, patella tracking problems, thromboembolic complications and other imponderables were discussed. The patient acknowledged the explanation, agreed to proceed with the plan and consent was signed. Patient is being admitted for inpatient treatment for surgery, pain control, PT, OT, prophylactic antibiotics, VTE prophylaxis, progressive ambulation and ADL's and discharge planning. The  patient is planning to be discharged home with home health services

## 2013-01-27 NOTE — Anesthesia Preprocedure Evaluation (Addendum)
Anesthesia Evaluation  Patient identified by MRN, date of birth, ID band Patient awake    Reviewed: Allergy & Precautions, H&P , NPO status , Patient's Chart, lab work & pertinent test results  Airway Mallampati: II TM Distance: >3 FB Neck ROM: Full    Dental no notable dental hx.    Pulmonary neg pulmonary ROS,  breath sounds clear to auscultation  Pulmonary exam normal       Cardiovascular hypertension, Pt. on medications Rhythm:Regular Rate:Normal     Neuro/Psych TIACVA, No Residual Symptoms negative psych ROS   GI/Hepatic negative GI ROS, Neg liver ROS,   Endo/Other  negative endocrine ROS  Renal/GU negative Renal ROS  negative genitourinary   Musculoskeletal negative musculoskeletal ROS (+)   Abdominal   Peds negative pediatric ROS (+)  Hematology negative hematology ROS (+)   Anesthesia Other Findings   Reproductive/Obstetrics negative OB ROS                          Anesthesia Physical Anesthesia Plan  ASA: III  Anesthesia Plan: Spinal   Post-op Pain Management:    Induction:   Airway Management Planned: Simple Face Mask  Additional Equipment:   Intra-op Plan:   Post-operative Plan:   Informed Consent: I have reviewed the patients History and Physical, chart, labs and discussed the procedure including the risks, benefits and alternatives for the proposed anesthesia with the patient or authorized representative who has indicated his/her understanding and acceptance.   Dental advisory given  Plan Discussed with: CRNA  Anesthesia Plan Comments:         Anesthesia Quick Evaluation

## 2013-01-27 NOTE — Progress Notes (Signed)
Utilization review completed.  

## 2013-01-28 LAB — CBC
Hemoglobin: 11 g/dL — ABNORMAL LOW (ref 12.0–15.0)
MCH: 32.8 pg (ref 26.0–34.0)
MCHC: 34.3 g/dL (ref 30.0–36.0)
MCV: 95.8 fL (ref 78.0–100.0)
Platelets: 184 10*3/uL (ref 150–400)
RBC: 3.35 MIL/uL — ABNORMAL LOW (ref 3.87–5.11)
WBC: 8.2 10*3/uL (ref 4.0–10.5)

## 2013-01-28 LAB — BASIC METABOLIC PANEL
BUN: 11 mg/dL (ref 6–23)
CO2: 28 mEq/L (ref 19–32)
Calcium: 8.6 mg/dL (ref 8.4–10.5)
Chloride: 97 mEq/L (ref 96–112)
Creatinine, Ser: 0.69 mg/dL (ref 0.50–1.10)
GFR calc non Af Amer: 84 mL/min — ABNORMAL LOW (ref >90)
Glucose, Bld: 130 mg/dL — ABNORMAL HIGH (ref 70–99)

## 2013-01-28 NOTE — Progress Notes (Signed)
Physical Therapy Treatment Patient Details Name: Lisa Porter MRN: [id number] DOB: 01-07-1941 Today's Date: 01/28/2013 Time: 8657-8469 PT Time Calculation (min): 29 min  PT Assessment / Plan / Recommendation  History of Present Illness     PT Comments   Pt continues motivated but ltd by ongoing nausea  Follow Up Recommendations  Home health PT     Does the patient have the potential to tolerate intense rehabilitation     Barriers to Discharge        Equipment Recommendations  None recommended by PT    Recommendations for Other Services OT consult  Frequency 7X/week   Progress towards PT Goals Progress towards PT goals: Progressing toward goals  Plan Current plan remains appropriate    Precautions / Restrictions Precautions Precautions: Knee Required Braces or Orthoses: Knee Immobilizer - Right Knee Immobilizer - Right: Discontinue once straight leg raise with < 10 degree lag Restrictions Weight Bearing Restrictions: No Other Position/Activity Restrictions: WBAT   Pertinent Vitals/Pain 4/10; premed    Mobility  Bed Mobility Bed Mobility: Supine to Sit Supine to Sit: 3: Mod assist Details for Bed Mobility Assistance: cues for sequence and use of L LE to self assist Transfers Transfers: Sit to Stand;Stand to Sit Sit to Stand: 3: Mod assist;With upper extremity assist;From chair/3-in-1;With armrests Stand to Sit: 3: Mod assist;With upper extremity assist;To bed Details for Transfer Assistance: cues for LE management and use of UEs to self assist Ambulation/Gait Ambulation/Gait Assistance: 1: +2 Total assist Ambulation/Gait: Patient Percentage: 70% Ambulation Distance (Feet): 34 Feet Assistive device: Rolling walker Ambulation/Gait Assistance Details: cues for sequence, posture, position from RW, stride length Gait Pattern: Step-to pattern;Decreased step length - right;Decreased step length - left;Shuffle;Trunk flexed Gait velocity: decr General Gait Details:  multiple short rests to complete taskc2* ongoing nausea    Exercises     PT Diagnosis:    PT Problem List:   PT Treatment Interventions:     PT Goals (current goals can now be found in the care plan section) Acute Rehab PT Goals Patient Stated Goal: Resume previous lifestyle with decreased pain PT Goal Formulation: With patient Time For Goal Achievement: 02/03/13 Potential to Achieve Goals: Good  Visit Information  Last PT Received On: 01/28/13 Assistance Needed: +2 (2* pt nausea)    Subjective Data  Subjective: I want to make it to the hallway Patient Stated Goal: Resume previous lifestyle with decreased pain   Cognition  Cognition Arousal/Alertness: Awake/alert Behavior During Therapy: WFL for tasks assessed/performed Overall Cognitive Status: Within Functional Limits for tasks assessed    Balance     End of Session PT - End of Session Equipment Utilized During Treatment: Gait belt;Right knee immobilizer Activity Tolerance: Other (comment);Patient limited by fatigue Patient left: in bed;with call bell/phone within reach;with family/visitor present Nurse Communication: Mobility status   GP     Lisa Porter 01/28/2013, 3:55 PM

## 2013-01-28 NOTE — Progress Notes (Signed)
Physical Therapy Treatment Patient Details Name: Lisa Porter MRN: [id number] DOB: 01/19/1941 Today's Date: 01/28/2013 Time: 7829-5621 PT Time Calculation (min): 32 min  PT Assessment / Plan / Recommendation  History of Present Illness     PT Comments     Follow Up Recommendations  Home health PT     Does the patient have the potential to tolerate intense rehabilitation     Barriers to Discharge        Equipment Recommendations  None recommended by PT    Recommendations for Other Services OT consult  Frequency 7X/week   Progress towards PT Goals Progress towards PT goals: Progressing toward goals  Plan Current plan remains appropriate    Precautions / Restrictions Precautions Precautions: Knee Required Braces or Orthoses: Knee Immobilizer - Right Knee Immobilizer - Right: Discontinue once straight leg raise with < 10 degree lag Restrictions Weight Bearing Restrictions: No Other Position/Activity Restrictions: WBAT   Pertinent Vitals/Pain 5/10; premed, cold packs provided    Mobility  Bed Mobility Bed Mobility: Supine to Sit Supine to Sit: 3: Mod assist Details for Bed Mobility Assistance: cues for sequence and use of L LE to self assist Transfers Transfers: Sit to Stand;Stand to Sit Sit to Stand: 3: Mod assist;With upper extremity assist;From bed Stand to Sit: 3: Mod assist;With upper extremity assist;To chair/3-in-1 Details for Transfer Assistance: cues for LE management and use of UEs to self assist Ambulation/Gait Ambulation/Gait Assistance: 1: +2 Total assist Ambulation/Gait: Patient Percentage: 70% Ambulation Distance (Feet): 16 Feet Assistive device: Rolling walker Ambulation/Gait Assistance Details: cues for sequence, posture, stride length, and position from RW Gait Pattern: Step-to pattern;Decreased step length - right;Decreased step length - left;Shuffle;Trunk flexed    Exercises Total Joint Exercises Ankle Circles/Pumps: AROM;Supine;Both;15  reps Quad Sets: AROM;Both;10 reps;Supine Heel Slides: AAROM;15 reps;Right;Supine Straight Leg Raises: AAROM;10 reps;Supine;Right Goniometric ROM: AAROM R knee -10 - 40   PT Diagnosis:    PT Problem List:   PT Treatment Interventions:     PT Goals (current goals can now be found in the care plan section) Acute Rehab PT Goals Patient Stated Goal: Resume previous lifestyle with decreased pain PT Goal Formulation: With patient Time For Goal Achievement: 02/03/13 Potential to Achieve Goals: Good  Visit Information  Last PT Received On: 01/28/13 Assistance Needed: +2 (Pt nausea)    Subjective Data  Subjective: I've been nauseous all morning but doing a little better and want to try` Patient Stated Goal: Resume previous lifestyle with decreased pain   Cognition  Cognition Arousal/Alertness: Awake/alert Behavior During Therapy: WFL for tasks assessed/performed Overall Cognitive Status: Within Functional Limits for tasks assessed    Balance     End of Session PT - End of Session Equipment Utilized During Treatment: Gait belt;Right knee immobilizer Activity Tolerance: Other (comment);Patient limited by fatigue (nausea) Patient left: in chair;with call bell/phone within reach;with family/visitor present Nurse Communication: Mobility status   GP     Lisa Porter 01/28/2013, 12:59 PM

## 2013-01-28 NOTE — Progress Notes (Signed)
   CARE MANAGEMENT NOTE 01/28/2013  Patient:  MEDHA, PIPPEN   Account Number:  [id number]  Date Initiated:  01/28/2013  Documentation initiated by:  [facility]  Subjective/Objective Assessment:   RIGHT TOTAL KNEE ARTHROPLASTY (Right)     Action/Plan:   HH PT recommended   Anticipated DC Date:  01/30/2013   Anticipated DC Plan:  HOME W HOME HEALTH SERVICES      DC Planning Services  CM consult      Choice offered to / List presented to:          [facility] arranged  HH-2 PT      [facility] agency  [facility]   Status of service:  Completed, signed off Medicare Important Message given?   (If response is "NO", the following Medicare IM given date fields will be blank) Date Medicare IM given:   Date Additional Medicare IM given:    Discharge Disposition:  HOME W HOME HEALTH SERVICES  Per UR Regulation:    If discussed at Long Length of Stay Meetings, dates discussed:    Comments:  01/28/2013 1400 NCM spoke to pt and states she has RW and 3n1 at home. Explained NCM will follow up with surgeon to see if CPM will be needed at home. Pt is preoperatively set up with [place]. Isidoro Donning RN CCM Case Mgmt phone [phone]

## 2013-01-28 NOTE — Progress Notes (Signed)
Patient ID: Lisa Porter, female   DOB: 10-03-1940, 72 y.o.   MRN: [id number] Subjective: 1 Day Post-Op Procedure(s) (LRB): RIGHT TOTAL KNEE ARTHROPLASTY (Right) Awake, slurring words with recent IV narcotics, Pain as high as 10 overnight. Oriented x 4, Patient reports pain as marked.    Objective:   VITALS:  Temp:  [96.8 F (36 C)-100.1 F (37.8 C)] 99.1 F (37.3 C) (11/15 0550) Pulse Rate:  [61-81] 73 (11/15 0550) Resp:  [9-16] 16 (11/15 0550) BP: (123-178)/(55-81) 168/72 mmHg (11/15 0550) SpO2:  [100 %] 100 % (11/15 0550) Weight:  [82.555 kg (182 lb)] 82.555 kg (182 lb) (11/14 1045)  Neurologically intact ABD soft Neurovascular intact Sensation intact distally Intact pulses distally Dorsiflexion/Plantar flexion intact Incision: dressing C/D/I   LABS  Recent Labs  01/28/13 0724  HGB 11.0*  WBC 8.2  PLT 184    Recent Labs  01/28/13 0724  NA 133*  K 3.9  CL 97  CO2 28  BUN 11  CREATININE 0.69  GLUCOSE 130*   No results found for this basename: LABPT, INR,  in the last 72 hours   Assessment/Plan: 1 Day Post-Op Procedure(s) (LRB): RIGHT TOTAL KNEE ARTHROPLASTY (Right)  Advance diet Up with therapy D/C IV fluids Foley discontinued. Continue IVF as she is still nauseated and vomiting. Encourage cough and deep breath.  Charizma Gardiner E 01/28/2013, 9:10 AM

## 2013-01-28 NOTE — Op Note (Signed)
NAMEMARLIN, BRYS NO.:  [id number]  MEDICAL RECORD NO.:  [id number]  LOCATION:  1611                         FACILITY:  [facility]  PHYSICIAN:  Vanita Panda. Magnus Ivan, M.D.DATE OF BIRTH:  02-Nov-1940  DATE OF PROCEDURE:  01/27/2013 DATE OF DISCHARGE:                              OPERATIVE REPORT   PREOPERATIVE DIAGNOSES:  Severe osteoarthritis and degenerative joint disease, right knee.  POSTOPERATIVE DIAGNOSES:  Severe osteoarthritis and degenerative joint disease, right knee.  PROCEDURE:  Right total knee arthroplasty.  IMPLANTS:  Stryker triathlon knee with size 3 femur, size 2 tibial tray, 9-mm fix bearing polyethylene insert, size 29 patellar button.  SURGEON:  Vanita Panda. Magnus Ivan, M.D.  ASSISTANT:  Richardean Canal, PA-C  ANESTHESIA:  Spinal.  ANTIBIOTICS:  2 g IV Ancef.  BLOOD LOSS:  Less than 100 mL.  TOURNIQUET TIME:  Less than 1 hour.  COMPLICATIONS:  None.  INDICATIONS:  Ms. Reily is a very pleasant 72 year old female with debilitating end-stage arthritis, involving her right knee.  She has tried numerous injections and has gotten to where it affects her activities of daily living.  She has failed conservative treatment.  Her pain is daily.  Her mobility is limited and her activities of daily living are limited.  At this point, with very conservative treatment, she wished to proceed with total knee arthroplasty.  She understands the risks of acute blood loss anemia, nerve and vessel injury, fracture, infection, and DVT.  The goals are to decrease pain, improved mobility, and overall improved quality of life.  PROCEDURE DESCRIPTION:  After informed consent was obtained, appropriate right knee was marked.  She was brought to the operating room, and set up on the operating table, so spinal anesthesia can be obtained.  She was then laid down in the supine position.  A Foley catheter was placed and a nonsterile tourniquet was placed around  her upper right thigh. Her right leg was prepped and draped from the thigh down the toes with DuraPrep and sterile drapes.  A time-out was called to identify the correct patient, correct right knee.  We then used an Esmarch to wrap out the leg and tourniquet was inflated to 300 mm of pressure.  We made a midline incision directly over the patella and carried this proximally and distally and dissected down to the knee joint.  We performed a medial parapatellar arthrotomy and found a large joint effusion.  Right away, could see the knee was basically devoid of cartilage, especially the patellofemoral joint and the medial compartment.  The lateral compartment also had extensive cartilage loss.  She has a valgus knee as well.  We then cleaned the knee of osteophytes and debris including remnants of the medial and lateral meniscus, and the ACL and PCL.  We then had the knee flexed at 90-degree position and placed an external medullary guide for cutting her tibia.  We then set this for a neutral slope and correcting for varus and valgus and then we made our tibial cut about 9 mm off the high side.  Attention was then turned to the femur.  Using an intramedullary guide and the femur and the notch, we  set our femoral cutting guide for a distal femoral cut at 5 degrees externally rotated her right knee, and we took 10 mm off the distal femoral cut.  Bringing the knee back down, we placed an extension block which is a 9-mm extension block in the knee and I was pleased with her extension gap.  We then went back to the femur and put her femoral sizing guide on based off the epicondylar axis and chose a size 3 femur. We then put a 4-in-1 cutting block, made our anterior and posterior cuts followed by our chamfer cuts.  We then made our femoral box cut and went back to the tibia.  Based on our tibial trial sizing, we trialed a size 2 tibia and we put the tibial base plate on for this and made our  keel punch.  Then with the femur, the trial size 3 femur and the trial 2 tibia, we placed a 9-mm polyethylene insert, and brought the knee back down to a flexed position between the flexion and extension.  I was pleased with her coverage and her motion, as well as stability.  We then measured for patellar cut and chose a 29 patellar button.  We made our patellar cut down to a size 14-mm thickness and drilled the holes for the patellar button.  With all trial components then placed, put the knee through range of motion, I was pleased with this and stability of the knee as well.  We then removed all trial instrumentation, copiously irrigated the knee with normal saline solution.  With normal saline solution using pulsatile lavage, we then opened up our implants and cemented the real Stryker triathlon size 2 tibial tray, followed by the real size 3 femur.  We placed the real 9-mm polyethylene insert and cemented the 29 patellar button.  Once the cement had hardened, we let the tourniquet down and hemostasis was obtained with electrocautery.  We then thoroughly irrigated the knee again with another liter of normal saline solution.  I then closed the arthrotomy with interrupted #1 Vicryl suture, followed by 2-0 Vicryl in subcutaneous tissue, and staples on the skin.  Xeroform and a well-padded sterile dressing was applied.  She was taken to the recovery room in stable condition.  All final counts were correct and there were no complications noted.  Of note, Richardean Canal, PA-C was present in the entire case and his presence was crucial for retracting, assisting with implant positioning, and closure of the wound.     Vanita Panda. Magnus Ivan, M.D.     CYB/MEDQ  D:  01/27/2013  T:  01/27/2013  Job:  [id number]

## 2013-01-28 NOTE — Progress Notes (Signed)
OT Cancellation Note  Patient Details Name: Lisa Porter MRN: [id number] DOB: 11-Sep-1940   Cancelled Treatment:    Reason Eval/Treat Not Completed: Medical issues which prohibited therapy (pt with N/V this date, asked to defer until tomorrow)  Yohan Samons A 01/28/2013, 10:24 AM

## 2013-01-29 MED ORDER — VITAMINS A & D EX OINT
TOPICAL_OINTMENT | CUTANEOUS | Status: AC
  Administered 2013-01-29: 5
  Filled 2013-01-29: qty 5

## 2013-01-29 MED ORDER — TRAMADOL HCL 50 MG PO TABS: 50.0000 mg | ORAL_TABLET | Freq: Four times a day (QID) | ORAL | Status: DC | PRN

## 2013-01-29 NOTE — Progress Notes (Signed)
Patient ID: Lisa Porter, female   DOB: 23-Mar-1940, 72 y.o.   MRN: [id number] Subjective: 2 Days Post-Op Procedure(s) (LRB): RIGHT TOTAL KNEE ARTHROPLASTY (Right) Awake, alert and oriented x 4. She is up walking with PT assistance in hallway, pain is improved today Stopped narcotics because she was feeling side effects of N and V and stomach cramping.  Patient reports pain as mild.    Objective:   VITALS:  Temp:  [97.9 F (36.6 C)-99.1 F (37.3 C)] 99.1 F (37.3 C) (11/16 0502) Pulse Rate:  [81-82] 82 (11/16 0502) Resp:  [16] 16 (11/16 0502) BP: (157-177)/(80-81) 177/81 mmHg (11/16 0502) SpO2:  [96 %-97 %] 97 % (11/16 0502)  Neurologically intact ABD soft Neurovascular intact Sensation intact distally Intact pulses distally Dorsiflexion/Plantar flexion intact Incision: no drainage Dressing changed today.   LABS  Recent Labs  01/28/13 0724  HGB 11.0*  WBC 8.2  PLT 184    Recent Labs  01/28/13 0724  NA 133*  K 3.9  CL 97  CO2 28  BUN 11  CREATININE 0.69  GLUCOSE 130*   No results found for this basename: LABPT, INR,  in the last 72 hours   Assessment/Plan: 2 Days Post-Op Procedure(s) (LRB): RIGHT TOTAL KNEE ARTHROPLASTY (Right)  Advance diet Up with therapy D/C IV fluids Likely ready Tues  As she is progressing slowly.  Tonny Isensee E 01/29/2013, 11:17 AM

## 2013-01-29 NOTE — Progress Notes (Signed)
Physical Therapy Treatment Patient Details Name: Lisa Porter MRN: [id number] DOB: Nov 27, 1940 Today's Date: 01/29/2013 Time: 1030-1100 PT Time Calculation (min): 30 min  PT Assessment / Plan / Recommendation  History of Present Illness Patient is s/p R TKR   PT Comments   tolerated well, will begin stair training now that she is moving and feeling better.   Follow Up Recommendations  Home health PT     Does the patient have the potential to tolerate intense rehabilitation     Barriers to Discharge        Equipment Recommendations  None recommended by PT    Recommendations for Other Services    Frequency 7X/week   Progress towards PT Goals Progress towards PT goals: Progressing toward goals  Plan Current plan remains appropriate    Precautions / Restrictions Precautions Precautions: Knee Required Braces or Orthoses: Knee Immobilizer - Right Knee Immobilizer - Right: Discontinue once straight leg raise with < 10 degree lag Restrictions Weight Bearing Restrictions: No Other Position/Activity Restrictions: WBAT   Pertinent Vitals/Pain 3/10   Mobility  Bed Mobility Bed Mobility: Supine to Sit Supine to Sit: 3: Mod assist Details for Bed Mobility Assistance: cues for sequence and use of L LE to self assist Transfers Transfers: Sit to Stand;Stand to Sit Sit to Stand: 3: Mod assist;With upper extremity assist;From chair/3-in-1;With armrests Stand to Sit: 3: Mod assist;With upper extremity assist;To bed Details for Transfer Assistance: assistance to help rise and lower down and cues for LE placement Ambulation/Gait Ambulation/Gait Assistance: 4: Min assist Ambulation Distance (Feet): 50 Feet Assistive device: Rolling walker Ambulation/Gait Assistance Details: stpe to pattern very slow Gait Pattern: Step-to pattern;Decreased step length - right;Decreased step length - left;Shuffle;Trunk flexed Gait velocity: decr General Gait Details: multiple short rests to complete  taskc2* ongoing nausea    Exercises Total Joint Exercises Ankle Circles/Pumps: AROM;Supine;Both;15 reps Quad Sets: AROM;Both;10 reps;Supine Heel Slides: AAROM;15 reps;Right;Supine Knee Flexion: AAROM;Right;10 reps (sittng in recliner with l;eg over edge) Goniometric ROM: 0-70   PT Diagnosis:    PT Problem List:   PT Treatment Interventions:     PT Goals (current goals can now be found in the care plan section) Acute Rehab PT Goals Patient Stated Goal: Resume previous lifestyle with decreased pain PT Goal Formulation: With patient Time For Goal Achievement: 02/03/13 Potential to Achieve Goals: Good  Visit Information  Last PT Received On: 01/29/13 Assistance Needed: +1 History of Present Illness: Patient is s/p R TKR    Subjective Data  Subjective: I am feeling better today Patient Stated Goal: Resume previous lifestyle with decreased pain   Cognition  Cognition Arousal/Alertness: Awake/alert Behavior During Therapy: WFL for tasks assessed/performed Overall Cognitive Status: Within Functional Limits for tasks assessed    Balance     End of Session PT - End of Session Equipment Utilized During Treatment: Gait belt;Right knee immobilizer Activity Tolerance: Patient tolerated treatment well Patient left: in chair;with call bell/phone within reach;with family/visitor present Nurse Communication: Mobility status   GP     Marella Bile 01/29/2013, 1:31 PM Marella Bile, PT Pager: [phone] 01/29/2013

## 2013-01-29 NOTE — Plan of Care (Signed)
Problem: Consults Goal: Diagnosis- Total Joint Replacement Hemiarthroplasty     

## 2013-01-29 NOTE — Progress Notes (Signed)
01/29/13 1445  PT Visit Information  Last PT Received On 01/29/13  Assistance Needed +1  PT Time Calculation  PT Start Time 1405  PT Stop Time 1445  PT Time Calculation (min) 40 min  Subjective Data  Subjective I am tired this afternoon   Precautions  Precautions Knee  Required Braces or Orthoses Knee Immobilizer - Right  Knee Immobilizer - Right Discontinue once straight leg raise with < 10 degree lag  Restrictions  Weight Bearing Restrictions No  Other Position/Activity Restrictions WBAT  Cognition  Arousal/Alertness Awake/alert  Behavior During Therapy WFL for tasks assessed/performed  Overall Cognitive Status Within Functional Limits for tasks assessed  Bed Mobility  Bed Mobility Supine to Sit  Supine to Sit 3: Mod assist  Sit to Supine 3: Mod assist  Details for Bed Mobility Assistance cues for sequence and use of L LE to self assist  Transfers  Transfers Sit to Stand;Stand to Sit  Sit to Stand 4: Min assist  Stand to Sit 4: Min guard  Details for Transfer Assistance Educated and worked on sit to stand from high bed. Used a step stool backwards with RW to step up onto prior to sitting on high bed like at home. Husband present to help with simulation to home bed height. He will make a step for home.   Ambulation/Gait  Ambulation/Gait Assistance 4: Min assist  Ambulation Distance (Feet) 80 Feet  Assistive device Rolling walker  Gait Pattern Step-to pattern;Decreased step length - right;Decreased step length - left;Shuffle;Trunk flexed  Gait velocity very very slow  PT - End of Session  Equipment Utilized During Treatment Gait belt;Right knee immobilizer  Activity Tolerance Patient tolerated treatment well  Patient left with call bell/phone within reach;with family/visitor present;in bed  Nurse Communication Mobility status  PT - Assessment/Plan  PT Plan Current plan remains appropriate  PT Frequency 7X/week  Follow Up Recommendations Home health PT  PT equipment None  recommended by PT  PT Goal Progression  Progress towards PT goals Progressing toward goals  Acute Rehab PT Goals  PT Goal Formulation With patient  Time For Goal Achievement 02/03/13  Potential to Achieve Goals Good  PT General Charges  $$ ACUTE PT VISIT 1 Procedure  PT Treatments  $Gait Training 23-37 mins  $Therapeutic Activity 8-22 mins

## 2013-01-29 NOTE — Progress Notes (Signed)
Occupational Therapy Evaluation Patient Details Name: Lisa Porter MRN: [id number] DOB: [birth year] Today's Date: 01/29/2013 Time: 1191-4782 OT Time Calculation (min): 31 min  OT Assessment / Plan / Recommendation History of present illness Patient is s/p R TKR   Clinical Impression   Patient without nausea this date; feeling much better.    OT Assessment  Patient needs continued OT Services    Follow Up Recommendations  No OT follow up;Supervision/Assistance - 24 hour    Barriers to Discharge      Equipment Recommendations  3 in 1 bedside comode;Other (comment) (has shower DME and reacher)    Recommendations for Other Services    Frequency  Min 2X/week    Precautions / Restrictions Precautions Precautions: Knee Required Braces or Orthoses: Knee Immobilizer - Right Knee Immobilizer - Right: Discontinue once straight leg raise with < 10 degree lag Restrictions Weight Bearing Restrictions: No Other Position/Activity Restrictions: WBAT   Pertinent Vitals/Pain No c/o pain    ADL  Eating/Feeding: Independent Where Assessed - Eating/Feeding: Chair Grooming: Set up Where Assessed - Grooming: Supported sitting Lower Body Bathing: Simulated;Minimal assistance Where Assessed - Lower Body Bathing: Supported sitting Lower Body Dressing: Simulated;Moderate assistance Where Assessed - Lower Body Dressing: Supported sit to Pharmacist, hospital: Mining engineer Method: Sit to stand;Stand pivot Acupuncturist: Bedside commode Transfers/Ambulation Related to ADLs: Patient doing much better today, min A overall with ADL transfers ADL Comments: Educated on LB self-care including use of AE (pt has reacher and will get LH sponge). Husband to A with socks and shoes. Reviewed technique of dressing operated leg first. Patient verbalized understanding.    OT Diagnosis: Generalized weakness;Acute pain  OT Problem List: Decreased strength;Decreased  range of motion;Decreased knowledge of use of DME or AE;Pain OT Treatment Interventions: Self-care/ADL training;DME and/or AE instruction;Therapeutic activities;Patient/family education   OT Goals(Current goals can be found in the care plan section) Acute Rehab OT Goals Patient Stated Goal: Resume previous lifestyle with decreased pain OT Goal Formulation: With patient Time For Goal Achievement: 02/12/13 Potential to Achieve Goals: Good  Visit Information  Last OT Received On: 01/29/13 History of Present Illness: Patient is s/p R TKR       Prior Functioning     Home Living Family/patient expects to be discharged to:: Private residence Living Arrangements: Spouse/significant other Available Help at Discharge: Family Type of Home: House Home Access: Stairs to enter Secretary/administrator of Steps: 3+3 Entrance Stairs-Rails: Left Home Layout: One level Home Equipment: Environmental consultant - 2 wheels;Cane - single point;Shower seat (can borrow sister's tub bench if needed) Prior Function Level of Independence: Independent Communication Communication: No difficulties Dominant Hand: Right         Vision/Perception Vision - History Baseline Vision: No visual deficits   Cognition  Cognition Arousal/Alertness: Awake/alert Behavior During Therapy: WFL for tasks assessed/performed Overall Cognitive Status: Within Functional Limits for tasks assessed    Extremity/Trunk Assessment Upper Extremity Assessment Upper Extremity Assessment: Overall WFL for tasks assessed     Mobility       Exercise     Balance     End of Session OT - End of Session Equipment Utilized During Treatment: Rolling walker;Gait belt Activity Tolerance: Patient tolerated treatment well Patient left: in chair;with call bell/phone within reach;with family/visitor present Nurse Communication: Mobility status  GO     Banesa Tristan A 01/29/2013, 12:17 PM

## 2013-01-30 MED ORDER — HYDROCODONE-ACETAMINOPHEN 5-325 MG PO TABS: 1.0000 | ORAL_TABLET | Freq: Four times a day (QID) | ORAL | Status: DC | PRN

## 2013-01-30 MED ORDER — TRAMADOL HCL 50 MG PO TABS: 100.0000 mg | ORAL_TABLET | Freq: Four times a day (QID) | ORAL | Status: DC | PRN

## 2013-01-30 NOTE — Progress Notes (Signed)
Occupational Therapy Treatment Patient Details Name: Lisa Porter MRN: [id number] DOB: 1941-02-05 Today's Date: 01/30/2013 Time: 4098-1191 OT Time Calculation (min): 46 min  OT Assessment / Plan / Recommendation  History of present illness  s/p L TKA   OT comments  Pt. Awake and agreeable to participation in skilled o.t. Today.  Doing well with bed mobility, and all components of toileting with s/min guard a.  Sim. Tub transfer with bench.  Pt. Has great support for d/c home and all needed DME.  Note: d/c likely today after stair training with P.T.  Follow Up Recommendations  No OT follow up;Supervision/Assistance - 24 hour           Equipment Recommendations  3 in 1 bedside comode        Frequency Min 2X/week   Progress towards OT Goals Progress towards OT goals: Progressing toward goals  Plan Discharge plan remains appropriate    Precautions / Restrictions Precautions Precautions: Knee Required Braces or Orthoses: Knee Immobilizer - Right Knee Immobilizer - Right: Discontinue once straight leg raise with < 10 degree lag   Pertinent Vitals/Pain 3/10, assisted with repositioning    ADL  Grooming: Performed;Wash/dry hands;Supervision/safety Where Assessed - Grooming: Supported Copywriter, advertising: Performed;Min Pension scheme manager Method: Sit to stand;Stand Wellsite geologist: Raised toilet seat with arms (or 3-in-1 over toilet) Toileting - Clothing Manipulation and Hygiene: Performed;Supervision/safety Where Assessed - Engineer, mining and Hygiene: Standing Tub/Shower Transfer: Simulated;Min guard Tub/Shower Transfer Method: Other (comment) (sim. use with tub bench) Tub/Shower Transfer Equipment: Counsellor Used: Knee Immobilizer;Rolling walker Transfers/Ambulation Related to ADLs: min guard with all transfers today ADL Comments: pt. still using KI for amb. so pt. and pts. sister agree tub bench transfer more  approriate at this time.  pt. able to sim. in/out with tub bench with s/min guard a.  defer shower stall transfer to [facility] therapist, they agree and verbalized understanding       OT Goals(current goals can now be found in the care plan section)    Visit Information  Last OT Received On: 01/30/13                 Cognition  Cognition Arousal/Alertness: Awake/alert Behavior During Therapy: [facility] for tasks assessed/performed Overall Cognitive Status: Within Functional Limits for tasks assessed    Mobility  Bed Mobility Details for Bed Mobility Assistance: s/min guard a with hob flat and pt. self guiding b les out of bed Transfers Transfers: Sit to Stand;Stand to Sit Sit to Stand: 4: Min guard;From bed Stand to Sit: 4: Min guard;To chair/3-in-1 Details for Transfer Assistance: min guard a with cues for hand placement sit/stand, and stand/sit               End of Session OT - End of Session Equipment Utilized During Treatment: Rolling walker;Right knee immobilizer Activity Tolerance: Patient tolerated treatment well Patient left: in chair;with call bell/phone within reach;with family/visitor present       Robet Leu, COTA/L 01/30/2013, 7:59 AM

## 2013-01-30 NOTE — Progress Notes (Signed)
Subjective: 3 Days Post-Op Procedure(s) (LRB): RIGHT TOTAL KNEE ARTHROPLASTY (Right) Patient reports pain as mild.  Progressing well with PT.  Objective: Vital signs in last 24 hours: Temp:  [98.3 F (36.8 C)-99.5 F (37.5 C)] 98.3 F (36.8 C) (11/17 0459) Pulse Rate:  [72-83] 72 (11/17 0459) Resp:  [16-17] 16 (11/17 0459) BP: (123-148)/(68-75) 125/74 mmHg (11/17 0459) SpO2:  [95 %-97 %] 96 % (11/17 0459)  Intake/Output from previous day: 11/16 0701 - 11/17 0700 In: 400 [P.O.:400] Out: 1000 [Urine:1000] Intake/Output this shift:     Recent Labs  01/28/13 0724  HGB 11.0*    Recent Labs  01/28/13 0724  WBC 8.2  RBC 3.35*  HCT 32.1*  PLT 184    Recent Labs  01/28/13 0724  NA 133*  K 3.9  CL 97  CO2 28  BUN 11  CREATININE 0.69  GLUCOSE 130*  CALCIUM 8.6   No results found for this basename: LABPT, INR,  in the last 72 hours  Sensation intact distally Intact pulses distally Dorsiflexion/Plantar flexion intact Incision: no drainage No cellulitis present Compartment soft  Assessment/Plan: 3 Days Post-Op Procedure(s) (LRB): RIGHT TOTAL KNEE ARTHROPLASTY (Right) Discharge home with home health  Lisa Porter 01/30/2013, 7:24 AM

## 2013-01-30 NOTE — Discharge Summary (Signed)
Patient ID: Lisa Porter MRN: [id number] DOB/AGE: 1940-03-21 72 y.o.  Admit date: 01/27/2013 Discharge date: 01/30/2013  Admission Diagnoses:  Principal Problem:   Arthritis of knee, right   Discharge Diagnoses:  Same  Past Medical History  Diagnosis Date  . Hypertension   . Stroke 2000    no defecits/  TIA  2012  . GERD (gastroesophageal reflux disease)   . Arthritis   . Plantar fasciitis of right foot     Surgeries: Procedure(s): RIGHT TOTAL KNEE ARTHROPLASTY on 01/27/2013   Consultants:    Discharged Condition: Improved  Hospital Course: Lisa Porter is an 72 y.o. female who was admitted 01/27/2013 for operative treatment ofArthritis of knee, right. Patient has severe unremitting pain that affects sleep, daily activities, and work/hobbies. After pre-op clearance the patient was taken to the operating room on 01/27/2013 and underwent  Procedure(s): RIGHT TOTAL KNEE ARTHROPLASTY.    Patient was given perioperative antibiotics: Anti-infectives   Start     Dose/Rate Route Frequency Ordered Stop   01/27/13 1400  ceFAZolin (ANCEF) IVPB 1 g/50 mL premix     1 g 100 mL/hr over 30 Minutes Intravenous Every 6 hours 01/27/13 1056 01/27/13 2303   01/27/13 0528  ceFAZolin (ANCEF) IVPB 2 g/50 mL premix     2 g 100 mL/hr over 30 Minutes Intravenous On call to O.R. 01/27/13 0528 01/27/13 0737       Patient was given sequential compression devices, early ambulation, and chemoprophylaxis to prevent DVT.  Patient benefited maximally from hospital stay and there were no complications.    Recent vital signs: Patient Vitals for the past 24 hrs:  BP Temp Temp src Pulse Resp SpO2  01/30/13 0459 125/74 mmHg 98.3 F (36.8 C) Oral 72 16 96 %  01/29/13 2131 148/75 mmHg 99.5 F (37.5 C) Oral 83 17 97 %  01/29/13 1533 123/68 mmHg 99.2 F (37.3 C) Oral 81 16 95 %     Recent laboratory studies:  Recent Labs  01/28/13 0724  WBC 8.2  HGB 11.0*  HCT 32.1*  PLT 184  NA 133*  K 3.9   CL 97  CO2 28  BUN 11  CREATININE 0.69  GLUCOSE 130*  CALCIUM 8.6     Discharge Medications:     Medication List    STOP taking these medications       acetaminophen 500 MG tablet  Commonly known as:  TYLENOL     TYLENOL ARTHRITIS PAIN 650 MG CR tablet  Generic drug:  acetaminophen      TAKE these medications       aspirin EC 81 MG tablet  Take 81 mg by mouth at bedtime.     atorvastatin 80 MG tablet  Commonly known as:  LIPITOR  Take 80 mg by mouth at bedtime.     CALCIUM 600 + D PO  Take 1 tablet by mouth every morning.     clopidogrel 75 MG tablet  Commonly known as:  PLAVIX  Take 75 mg by mouth at bedtime. States Will stop 01/20/13     fluticasone 50 MCG/ACT nasal spray  Commonly known as:  FLONASE  Place 1 spray into the nose 2 (two) times daily.     guaiFENesin 600 MG 12 hr tablet  Commonly known as:  MUCINEX  Take 1,200 mg by mouth 2 (two) times daily.     multivitamin with minerals Tabs tablet  Take 1 tablet by mouth at bedtime.     OVER THE COUNTER MEDICATION  Take 1 tablet by mouth 2 (two) times daily. Glucosamine Chondroitin 1500mg /1200mg      quinapril 40 MG tablet  Commonly known as:  ACCUPRIL  Take 40 mg by mouth at bedtime.     quinapril-hydrochlorothiazide 20-12.5 MG per tablet  Commonly known as:  ACCURETIC  Take 1 tablet by mouth every morning.     traMADol 50 MG tablet  Commonly known as:  ULTRAM  Take 2 tablets (100 mg total) by mouth every 6 (six) hours as needed for moderate pain.     vitamin B-12 500 MCG tablet  Commonly known as:  CYANOCOBALAMIN  Take 500 mcg by mouth every morning.     vitamin C 500 MG tablet  Commonly known as:  ASCORBIC ACID  Take 500 mg by mouth 2 (two) times daily.        Diagnostic Studies: Dg Chest 2 View  01/18/2013   CLINICAL DATA:  Preop for right knee surgery  EXAM: CHEST  2 VIEW  COMPARISON:  None.  FINDINGS: No active infiltrate or effusion is seen. Mediastinal contours appear normal. The  heart is within normal limits in size for age. There are degenerative changes throughout the thoracic spine. Surgical clips are present in the right upper quadrant from prior cholecystectomy.  IMPRESSION: No active lung disease.   Electronically Signed   By: Dwyane Dee M.D.   On: 01/18/2013 14:02   Dg Knee Right Port  01/27/2013   CLINICAL DATA:  Postop right total knee  EXAM: PORTABLE RIGHT KNEE - 1-2 VIEW  COMPARISON:  None.  FINDINGS: The knee demonstrates a total knee arthroplasty without evidence of hardware failure complication. There is no significant joint effusion. There is no fracture or dislocation. The alignment is anatomic. Post-surgical changes noted in the surrounding soft tissues.  IMPRESSION: Right total knee arthroplasty.   Electronically Signed   By: Elige Ko   On: 01/27/2013 10:04    Disposition: to home      Discharge Orders   Future Orders Complete By Expires   Call MD / Call 911  As directed    Comments:     If you experience chest pain or shortness of breath, CALL 911 and be transported to the hospital emergency room.  If you develope a fever above 101 F, pus (white drainage) or increased drainage or redness at the wound, or calf pain, call your surgeon's office.   Constipation Prevention  As directed    Comments:     Drink plenty of fluids.  Prune juice may be helpful.  You may use a stool softener, such as Colace (over the counter) 100 mg twice a day.  Use MiraLax (over the counter) for constipation as needed.   CPM  As directed    Scheduling Instructions:     CPM at home on right knee for 2 hours twice daily (am/pm). Set CPM from full extension to 90 degrees flexion.   Diet - low sodium heart healthy  As directed    Discharge patient  As directed    Discharge wound care:  As directed    Comments:     Keep dressing clean and intact. May shower with dressing intact . Remove dressing Thursday and shower. After showering apply clean dressing.   Increase  activity slowly as tolerated  As directed    Weight bearing as tolerated  As directed    Questions:     Laterality:     Extremity:  Follow-up Information   Follow up with [facility]. [facility])    Contact information:   [address] [place] [address] [phone]       Follow up with Kathryne Hitch, MD In 2 weeks.   Specialty:  Orthopedic Surgery   Contact information:   [address] [place] [address] [phone]       Follow up with Kathryne Hitch, MD In 2 weeks.   Specialty:  Orthopedic Surgery   Contact information:   [address] [place] [address] [phone]        Signed: Kathryne Hitch 01/30/2013, 7:33 AM

## 2013-01-30 NOTE — Progress Notes (Signed)
Physical Therapy Treatment Patient Details Name: Lisa Porter MRN: [id number] DOB: 1940-08-17 Today's Date: 01/30/2013 Time: 7846-9629 PT Time Calculation (min): 41 min  PT Assessment / Plan / Recommendation  History of Present Illness Patient is s/p R TKR   PT Comments   Pt progressing well; pain right knee 4/10 with activity, she was premedicated  Follow Up Recommendations  Home health PT     Does the patient have the potential to tolerate intense rehabilitation     Barriers to Discharge        Equipment Recommendations  None recommended by PT    Recommendations for Other Services    Frequency 7X/week   Progress towards PT Goals Progress towards PT goals: Progressing toward goals  Plan Current plan remains appropriate    Precautions / Restrictions Precautions Precautions: Knee Required Braces or Orthoses: Knee Immobilizer - Right Knee Immobilizer - Right: Discontinue once straight leg raise with < 10 degree lag Restrictions Weight Bearing Restrictions: No Other Position/Activity Restrictions: WBAT   Pertinent Vitals/Pain     Mobility  Bed Mobility Bed Mobility: Not assessed Transfers Transfers: Sit to Stand;Stand to Sit Sit to Stand: 5: Supervision;From chair/3-in-1;With upper extremity assist Stand to Sit: 5: Supervision;To chair/3-in-1;With upper extremity assist Details for Transfer Assistance: cues for hand placement Ambulation/Gait Ambulation/Gait Assistance: 5: Supervision;4: Min guard Ambulation Distance (Feet): 65 Feet Assistive device: Rolling walker Ambulation/Gait Assistance Details: cues to push RW Gait Pattern: Step-to pattern;Decreased step length - right;Decreased step length - left;Shuffle;Trunk flexed Gait velocity: slow Stairs: Yes Stairs Assistance: 4: Min guard Stair Management Technique: One rail Left;Forwards;With crutches;Step to pattern Number of Stairs: 3 (times 2)    Exercises Total Joint Exercises Ankle Circles/Pumps:  AROM;Both;10 reps Quad Sets: AROM;Strengthening;Both;10 reps Towel Squeeze: AROM;Strengthening;Both;10 reps Short Arc QuadBarbaraann Porter;Right;10 reps Heel Slides: AROM;AAROM;10 reps;Right Hip ABduction/ADduction: AAROM;Right;10 reps Straight Leg Raises: AAROM;10 reps;Supine;Right Goniometric ROM: 0-65   PT Diagnosis:    PT Problem List:   PT Treatment Interventions:     PT Goals (current goals can now be found in the care plan section) Acute Rehab PT Goals Patient Stated Goal: Resume previous lifestyle with decreased pain Time For Goal Achievement: 02/03/13 Potential to Achieve Goals: Good  Visit Information  Last PT Received On: 01/30/13 Assistance Needed: +1 History of Present Illness: Patient is s/p R TKR    Subjective Data  Subjective: I am tired this afternoon  Patient Stated Goal: Resume previous lifestyle with decreased pain   Cognition  Cognition Arousal/Alertness: Awake/alert Behavior During Therapy: WFL for tasks assessed/performed Overall Cognitive Status: Within Functional Limits for tasks assessed    Balance     End of Session PT - End of Session Equipment Utilized During Treatment: Gait belt;Right knee immobilizer Activity Tolerance: Patient tolerated treatment well Patient left: in chair;with call bell/phone within reach;with family/visitor present Nurse Communication: Mobility status   GP     [facility] 01/30/2013, 11:57 AM

## 2013-01-31 DIAGNOSIS — Z96659 Presence of unspecified artificial knee joint: Secondary | ICD-10-CM | POA: Diagnosis not present

## 2013-01-31 DIAGNOSIS — I10 Essential (primary) hypertension: Secondary | ICD-10-CM | POA: Diagnosis not present

## 2013-01-31 DIAGNOSIS — IMO0001 Reserved for inherently not codable concepts without codable children: Secondary | ICD-10-CM | POA: Diagnosis not present

## 2013-01-31 DIAGNOSIS — M1990 Unspecified osteoarthritis, unspecified site: Secondary | ICD-10-CM | POA: Diagnosis not present

## 2013-01-31 DIAGNOSIS — M1710 Unilateral primary osteoarthritis, unspecified knee: Secondary | ICD-10-CM | POA: Diagnosis not present

## 2013-01-31 DIAGNOSIS — Z471 Aftercare following joint replacement surgery: Secondary | ICD-10-CM | POA: Diagnosis not present

## 2013-02-20 DIAGNOSIS — Z96659 Presence of unspecified artificial knee joint: Secondary | ICD-10-CM | POA: Diagnosis not present

## 2013-02-20 DIAGNOSIS — M25569 Pain in unspecified knee: Secondary | ICD-10-CM | POA: Diagnosis not present

## 2013-02-22 DIAGNOSIS — M25569 Pain in unspecified knee: Secondary | ICD-10-CM | POA: Diagnosis not present

## 2013-02-22 DIAGNOSIS — Z96659 Presence of unspecified artificial knee joint: Secondary | ICD-10-CM | POA: Diagnosis not present

## 2013-02-27 DIAGNOSIS — Z96659 Presence of unspecified artificial knee joint: Secondary | ICD-10-CM | POA: Diagnosis not present

## 2013-02-27 DIAGNOSIS — M25569 Pain in unspecified knee: Secondary | ICD-10-CM | POA: Diagnosis not present

## 2013-03-02 DIAGNOSIS — M25569 Pain in unspecified knee: Secondary | ICD-10-CM | POA: Diagnosis not present

## 2013-03-02 DIAGNOSIS — Z96659 Presence of unspecified artificial knee joint: Secondary | ICD-10-CM | POA: Diagnosis not present

## 2013-03-06 DIAGNOSIS — M25569 Pain in unspecified knee: Secondary | ICD-10-CM | POA: Diagnosis not present

## 2013-03-06 DIAGNOSIS — Z96659 Presence of unspecified artificial knee joint: Secondary | ICD-10-CM | POA: Diagnosis not present

## 2013-03-10 DIAGNOSIS — Z96659 Presence of unspecified artificial knee joint: Secondary | ICD-10-CM | POA: Diagnosis not present

## 2013-03-10 DIAGNOSIS — M25569 Pain in unspecified knee: Secondary | ICD-10-CM | POA: Diagnosis not present

## 2013-03-14 DIAGNOSIS — Z96659 Presence of unspecified artificial knee joint: Secondary | ICD-10-CM | POA: Diagnosis not present

## 2013-03-14 DIAGNOSIS — M25569 Pain in unspecified knee: Secondary | ICD-10-CM | POA: Diagnosis not present

## 2013-06-26 DIAGNOSIS — I6990 Unspecified sequelae of unspecified cerebrovascular disease: Secondary | ICD-10-CM | POA: Insufficient documentation

## 2014-02-01 DIAGNOSIS — N952 Postmenopausal atrophic vaginitis: Secondary | ICD-10-CM | POA: Insufficient documentation

## 2014-04-03 ENCOUNTER — Encounter: Payer: Self-pay | Admitting: Internal Medicine

## 2014-06-20 DIAGNOSIS — H25011 Cortical age-related cataract, right eye: Secondary | ICD-10-CM | POA: Diagnosis not present

## 2014-06-25 DIAGNOSIS — Z1231 Encounter for screening mammogram for malignant neoplasm of breast: Secondary | ICD-10-CM | POA: Diagnosis not present

## 2014-06-29 DIAGNOSIS — E785 Hyperlipidemia, unspecified: Secondary | ICD-10-CM | POA: Diagnosis not present

## 2014-06-29 DIAGNOSIS — I6990 Unspecified sequelae of unspecified cerebrovascular disease: Secondary | ICD-10-CM | POA: Diagnosis not present

## 2014-06-29 DIAGNOSIS — I10 Essential (primary) hypertension: Secondary | ICD-10-CM | POA: Diagnosis not present

## 2014-06-29 DIAGNOSIS — N952 Postmenopausal atrophic vaginitis: Secondary | ICD-10-CM | POA: Diagnosis not present

## 2014-06-29 DIAGNOSIS — E669 Obesity, unspecified: Secondary | ICD-10-CM | POA: Diagnosis not present

## 2014-06-29 DIAGNOSIS — R3914 Feeling of incomplete bladder emptying: Secondary | ICD-10-CM | POA: Diagnosis not present

## 2014-06-29 DIAGNOSIS — Z6832 Body mass index (BMI) 32.0-32.9, adult: Secondary | ICD-10-CM | POA: Diagnosis not present

## 2014-06-29 DIAGNOSIS — Z1389 Encounter for screening for other disorder: Secondary | ICD-10-CM | POA: Diagnosis not present

## 2014-06-29 DIAGNOSIS — R739 Hyperglycemia, unspecified: Secondary | ICD-10-CM | POA: Diagnosis not present

## 2014-06-29 DIAGNOSIS — M1990 Unspecified osteoarthritis, unspecified site: Secondary | ICD-10-CM | POA: Diagnosis not present

## 2014-07-06 NOTE — Op Note (Signed)
PATIENT NAME:  Lisa Porter, Lisa Porter MR#:  [id number] DATE OF BIRTH:  1940/05/01  DATE OF PROCEDURE:  08/01/2012  PREOPERATIVE DIAGNOSIS: Cataract, left eye.   POSTOPERATIVE DIAGNOSIS: Cataract, left eye.   PROCEDURE PERFORMED: Extracapsular cataract extraction using phacoemulsification with placement of Alcon [id number], 18.5 diopter posterior chamber lens, serial #[id number].  SURGEON: Loura Back. Delvonte Berenson, M.D.   ANESTHESIA: 4% lidocaine and 0.75% Marcaine, a 50-50 mixture with 10 units/mL of Hylenex added, given as a peribulbar.   ANESTHESIOLOGIST: Dr. Benjamine Mola.   COMPLICATIONS: None.   ESTIMATED BLOOD LOSS:  Less than 1 mL    DESCRIPTION OF PROCEDURE:  The patient was brought to the operating room and given a peribulbar block.  The patient was then prepped and draped in the usual fashion.  The vertical rectus muscles were imbricated using 5-0 silk sutures.  These sutures were then clamped to the sterile drapes as bridle sutures.  A limbal peritomy was performed extending two clock hours and hemostasis was obtained with cautery.  A partial thickness scleral groove was made at the surgical limbus and dissected anteriorly in a lamellar dissection using an Alcon crescent knife.  The anterior chamber was entered supero-temporally with a Superblade and through the lamellar dissection with a 2.6 mm keratome.  DisCoVisc was used to replace the aqueous and a continuous tear capsulorrhexis was carried out.  Hydrodissection and hydrodelineation were carried out with balanced salt and a 27 gauge canula.  The nucleus was rotated to confirm the effectiveness of the hydrodissection.  Phacoemulsification was carried out using a divide-and-conquer technique.  Total ultrasound time was 50 minutes and 6 seconds with an average power of 22.4 percent. CD of 20.39. No suture was placed.   Irrigation/aspiration was used to remove the residual cortex.  DisCoVisc was used to inflate the capsule and the internal incision was  enlarged to 3 mm with the crescent knife.  The intraocular lens was folded and inserted into the capsular bag using the Acrysert delivery system.  Irrigation/aspiration was used to remove the residual DisCoVisc.  Miostat was injected into the anterior chamber through the paracentesis track to inflate the anterior chamber and induce miosis.  The wound was checked for leaks and none were found. The conjunctiva was closed with cautery and the bridle sutures were removed.  Two drops of 0.3% Vigamox were placed on the eye.   An eye shield was placed on the eye.  The patient was discharged to the recovery room in good condition.    ____________________________ Loura Back Eular Panek, MD sad:cc D: 08/01/2012 14:10:30 ET T: 08/01/2012 16:16:56 ET JOB#: [id number]  cc: Remo Lipps A. Eliyana Pagliaro, MD, <Dictator> Martie Lee MD ELECTRONICALLY SIGNED 08/15/2012 13:56

## 2014-11-16 IMAGING — CR DG CHEST 2V
2 series · 2 of 2 positions shown · non-contrast
Comparison: None.

CLINICAL DATA: Preop for right knee surgery

EXAM:
CHEST  2 VIEW

[w chest pa]
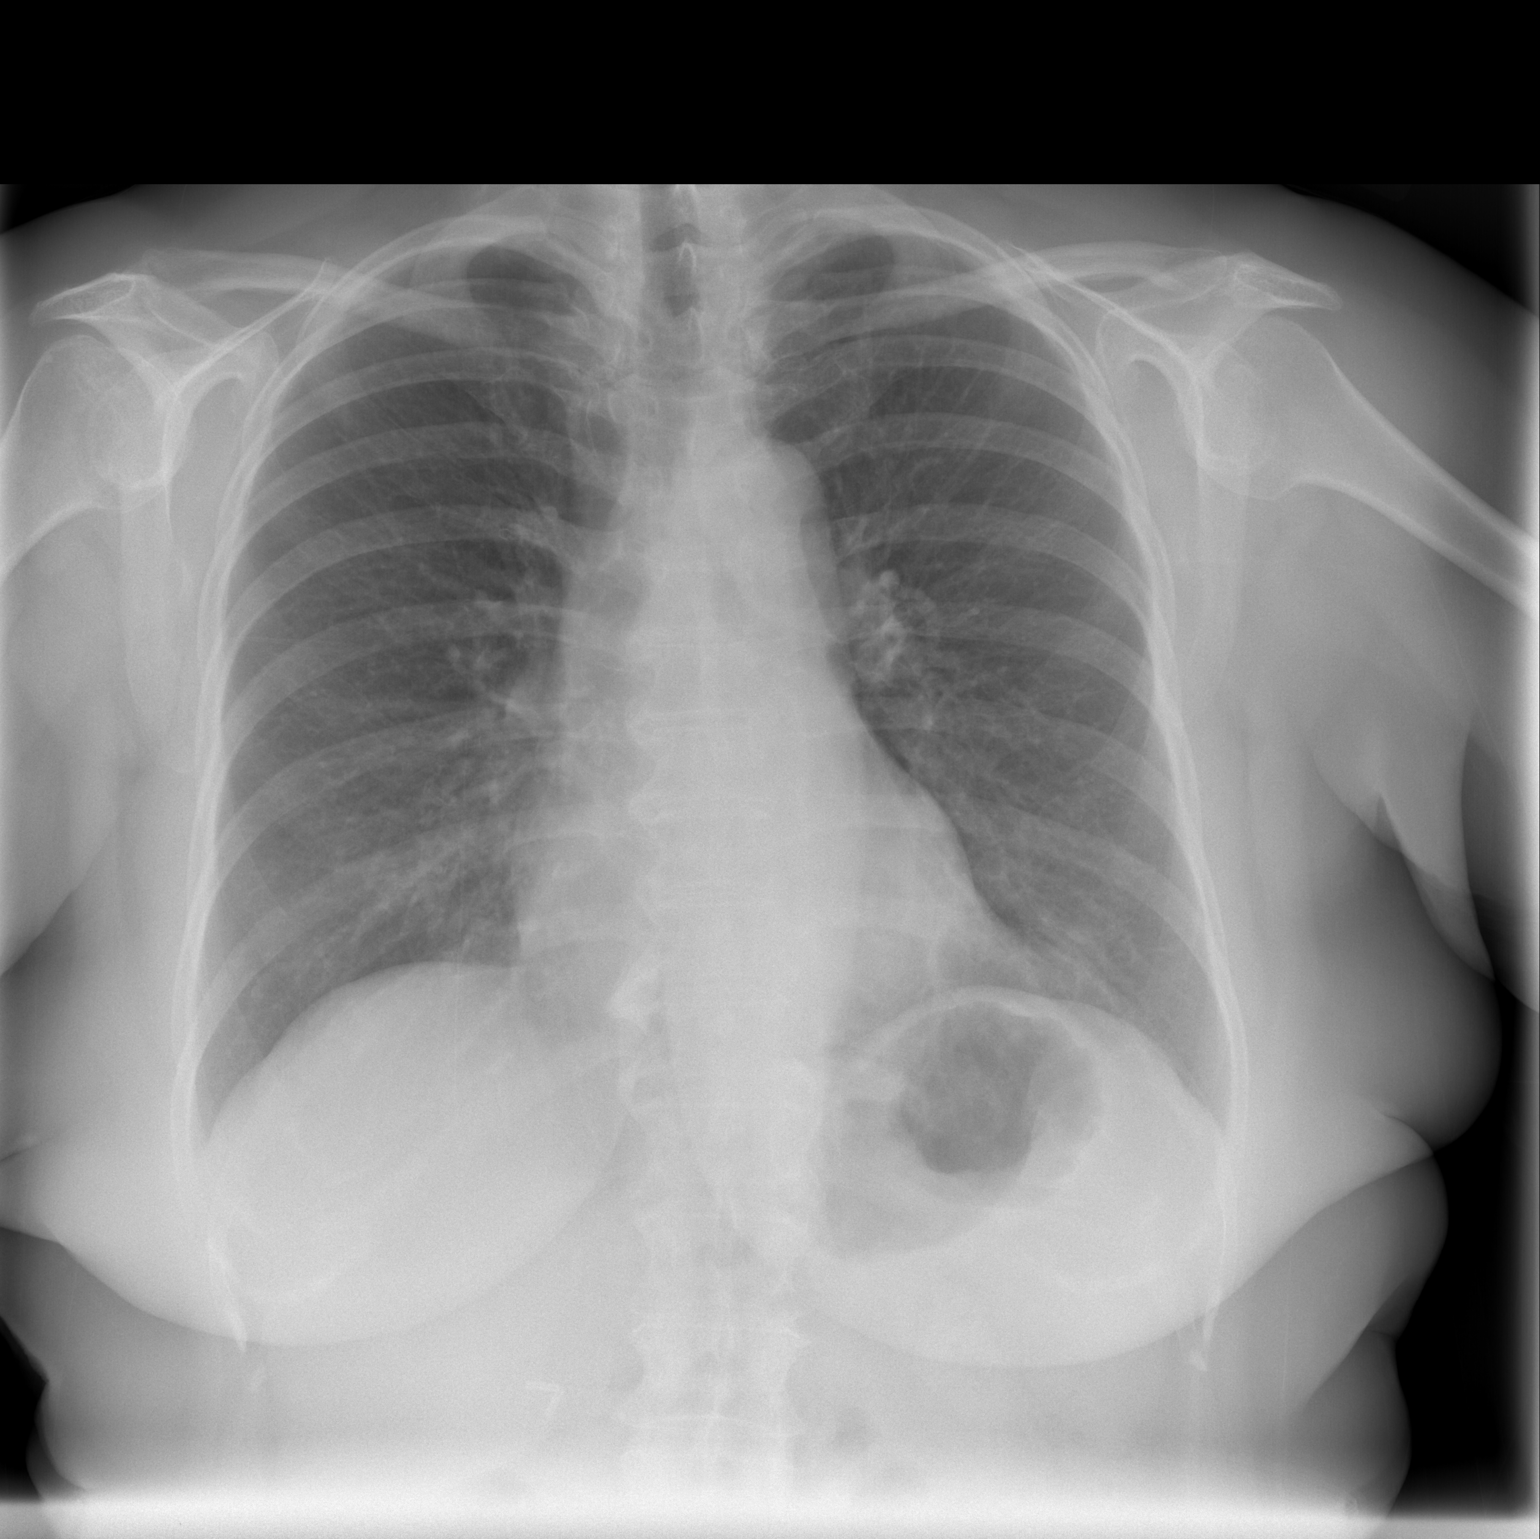

[w chest lat]
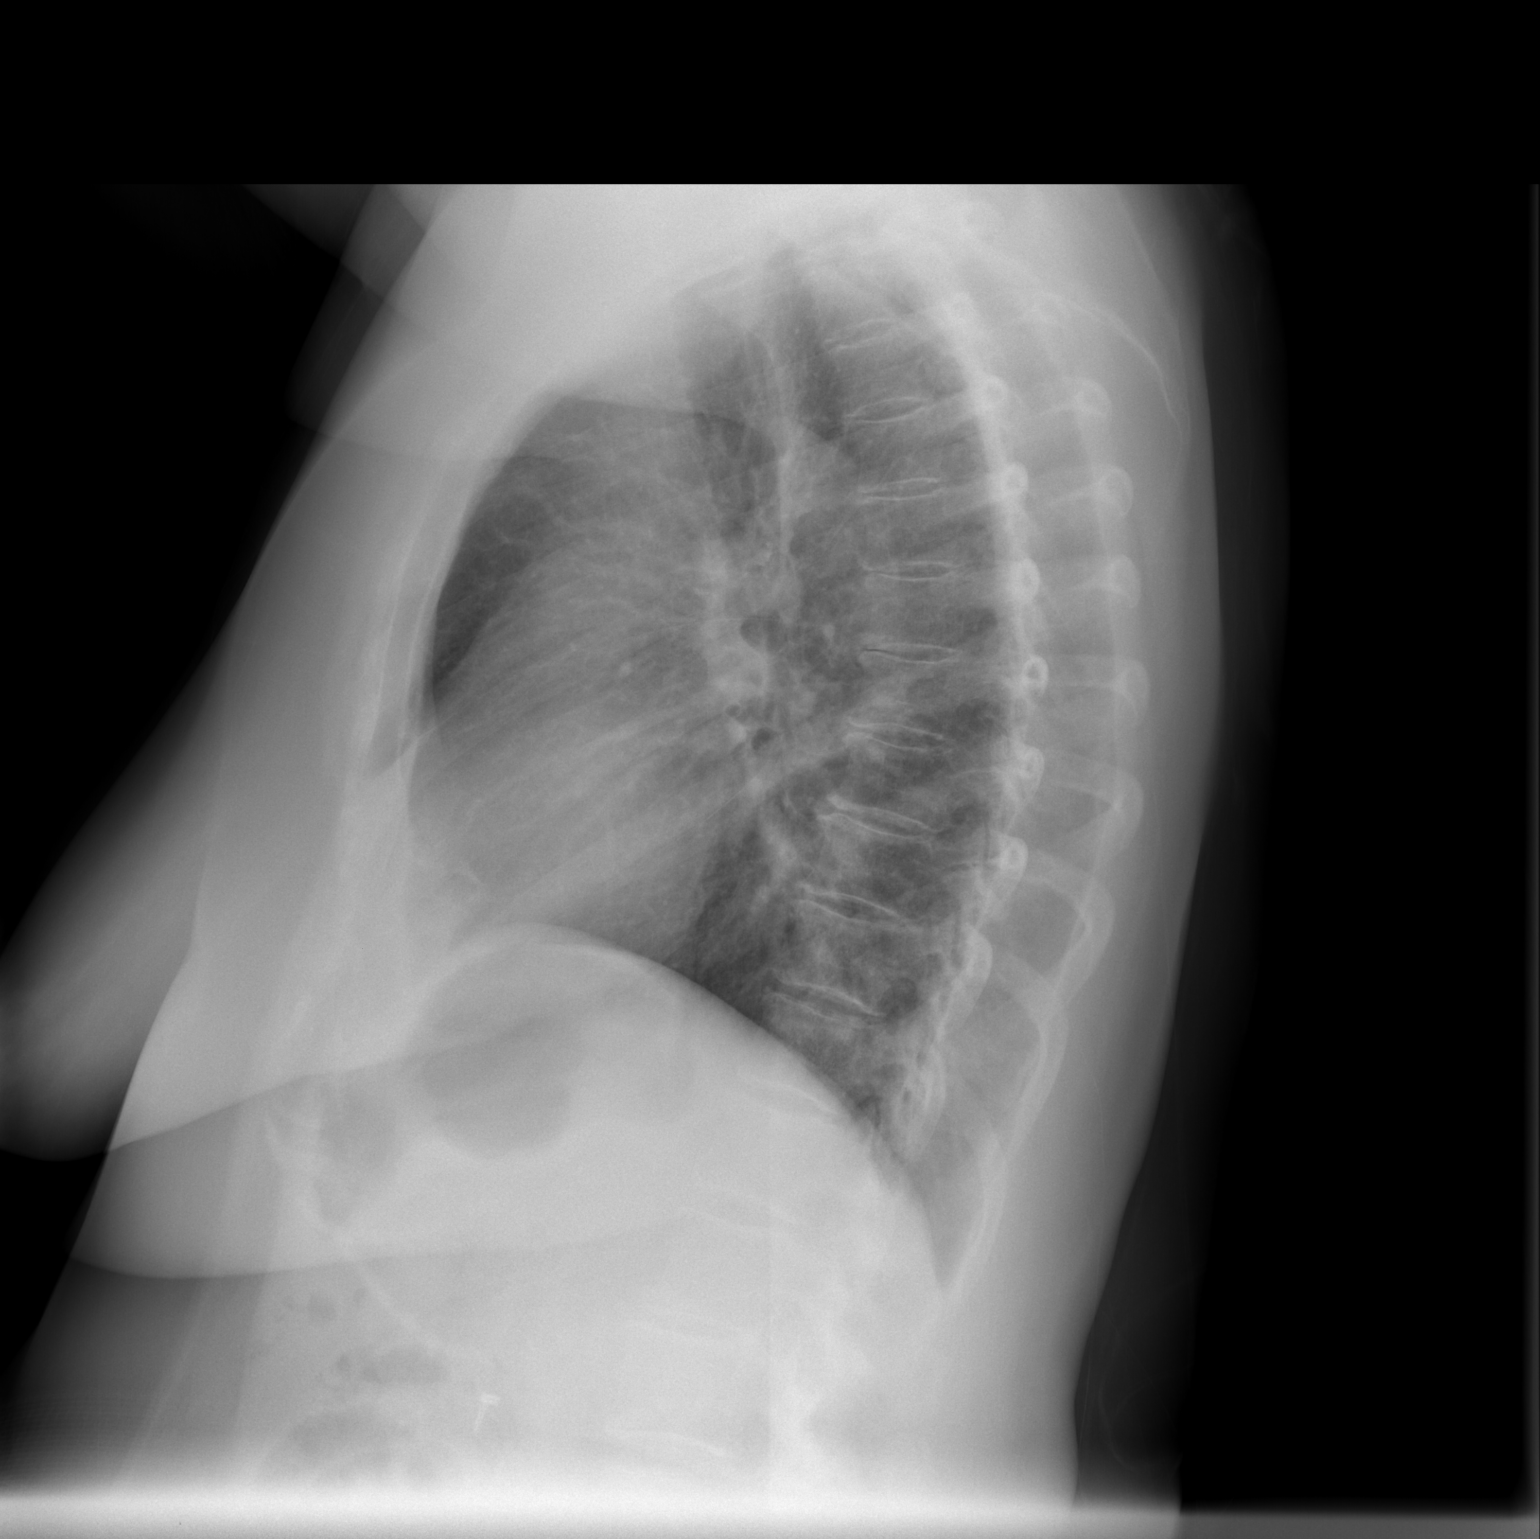

[2 of 2 positions shown; findings below may reference images not displayed]

FINDINGS: No active infiltrate or effusion is seen. Mediastinal contours
appear normal. The heart is within normal limits in size for age.
There are degenerative changes throughout the thoracic spine.
Surgical clips are present in the right upper quadrant from prior
cholecystectomy.
IMPRESSION: No active lung disease.

## 2014-12-21 DIAGNOSIS — I10 Essential (primary) hypertension: Secondary | ICD-10-CM | POA: Diagnosis not present

## 2014-12-21 DIAGNOSIS — Z0000 Encounter for general adult medical examination without abnormal findings: Secondary | ICD-10-CM | POA: Diagnosis not present

## 2014-12-21 DIAGNOSIS — R739 Hyperglycemia, unspecified: Secondary | ICD-10-CM | POA: Diagnosis not present

## 2014-12-21 DIAGNOSIS — E785 Hyperlipidemia, unspecified: Secondary | ICD-10-CM | POA: Diagnosis not present

## 2014-12-28 DIAGNOSIS — J309 Allergic rhinitis, unspecified: Secondary | ICD-10-CM | POA: Diagnosis not present

## 2014-12-28 DIAGNOSIS — M1611 Unilateral primary osteoarthritis, right hip: Secondary | ICD-10-CM | POA: Diagnosis not present

## 2014-12-28 DIAGNOSIS — Z0000 Encounter for general adult medical examination without abnormal findings: Secondary | ICD-10-CM | POA: Diagnosis not present

## 2014-12-28 DIAGNOSIS — E785 Hyperlipidemia, unspecified: Secondary | ICD-10-CM | POA: Diagnosis not present

## 2014-12-28 DIAGNOSIS — R1319 Other dysphagia: Secondary | ICD-10-CM | POA: Diagnosis not present

## 2014-12-28 DIAGNOSIS — R3914 Feeling of incomplete bladder emptying: Secondary | ICD-10-CM | POA: Diagnosis not present

## 2014-12-28 DIAGNOSIS — E669 Obesity, unspecified: Secondary | ICD-10-CM | POA: Diagnosis not present

## 2014-12-28 DIAGNOSIS — R739 Hyperglycemia, unspecified: Secondary | ICD-10-CM | POA: Diagnosis not present

## 2014-12-28 DIAGNOSIS — I10 Essential (primary) hypertension: Secondary | ICD-10-CM | POA: Diagnosis not present

## 2014-12-28 DIAGNOSIS — N952 Postmenopausal atrophic vaginitis: Secondary | ICD-10-CM | POA: Diagnosis not present

## 2014-12-28 DIAGNOSIS — Z6833 Body mass index (BMI) 33.0-33.9, adult: Secondary | ICD-10-CM | POA: Diagnosis not present

## 2014-12-28 DIAGNOSIS — I6990 Unspecified sequelae of unspecified cerebrovascular disease: Secondary | ICD-10-CM | POA: Diagnosis not present

## 2014-12-31 DIAGNOSIS — Z1212 Encounter for screening for malignant neoplasm of rectum: Secondary | ICD-10-CM | POA: Diagnosis not present

## 2015-07-05 DIAGNOSIS — M79604 Pain in right leg: Secondary | ICD-10-CM | POA: Insufficient documentation

## 2016-10-22 ENCOUNTER — Encounter: Payer: Self-pay | Admitting: Gastroenterology

## 2016-10-22 DIAGNOSIS — B351 Tinea unguium: Secondary | ICD-10-CM | POA: Insufficient documentation

## 2016-11-25 ENCOUNTER — Ambulatory Visit (INDEPENDENT_AMBULATORY_CARE_PROVIDER_SITE_OTHER): Payer: Medicare Other | Admitting: Podiatry

## 2016-11-25 ENCOUNTER — Encounter: Payer: Self-pay | Admitting: Podiatry

## 2016-11-25 VITALS — BP 151/87 | HR 84

## 2016-11-25 DIAGNOSIS — M79675 Pain in left toe(s): Secondary | ICD-10-CM

## 2016-11-25 DIAGNOSIS — M79674 Pain in right toe(s): Secondary | ICD-10-CM

## 2016-11-25 DIAGNOSIS — B351 Tinea unguium: Secondary | ICD-10-CM | POA: Diagnosis not present

## 2016-11-25 NOTE — Progress Notes (Signed)
   Subjective:    Patient ID: Lisa Porter, female    DOB: January 13, 1941, 76 y.o.   MRN: [id number]  HPI this patient presents the office with chief complaint of long, thick, ingrowing toenails, both great toes.  She says that she is had her nails done previously at a salon and developed an infection.  She was told by Dr. Virgina Jock to make an appointment with this clinic for treatment of her long ingrown nail.  She says the nails are painful walking and wearing her shoes.  She also says she had right knee replacement.  Secondly, she has a hard area noted on the top of the second toe right foot, which is nonpainful.  She presents the office today for an evaluation and treatment of her nails    Review of Systems  HENT:       Sinus problems  Musculoskeletal:       Joint pain       Objective:   Physical Exam GENERAL APPEARANCE: Alert, conversant. Appropriately groomed. No acute distress.  VASCULAR: Pedal pulses are  palpable at  [facility] and PT bilateral.  Capillary refill time is immediate to all digits,  Normal temperature gradient.  Digital hair growth is present bilateral  NEUROLOGIC: sensation is normal to 5.07 monofilament at 5/5 sites bilateral.  Light touch is intact bilateral, Muscle strength normal.  MUSCULOSKELETAL: acceptable muscle strength, tone and stability bilateral.  Intrinsic muscluature intact bilateral.  Rectus appearance of foot and digits noted bilateral. Bone spur second toe right foot at DIPJ. NAILS  thick disfigured discolored, ingrowing toenails, both great toes.  No evidence of any pus or bacterial drainage noted. DERMATOLOGIC: skin color, texture, and turgor are within normal limits.  No preulcerative lesions or ulcers  are seen, no interdigital maceration noted.  No open lesions present.   No drainage noted.         Assessment & Plan:  Onychomycosis  Hallux  B/L.   IE  Debridement of hallux nails.   RTC 3 months.  Told patient she has a nonpainful bone spur second toe  left foot   Gardiner Barefoot DPM

## 2016-12-25 ENCOUNTER — Encounter: Payer: Self-pay | Admitting: *Deleted

## 2016-12-29 ENCOUNTER — Encounter: Payer: Self-pay | Admitting: Gastroenterology

## 2016-12-29 ENCOUNTER — Encounter (INDEPENDENT_AMBULATORY_CARE_PROVIDER_SITE_OTHER): Payer: Self-pay

## 2016-12-29 ENCOUNTER — Ambulatory Visit (INDEPENDENT_AMBULATORY_CARE_PROVIDER_SITE_OTHER): Payer: Medicare Other | Admitting: Gastroenterology

## 2016-12-29 ENCOUNTER — Telehealth: Payer: Self-pay | Admitting: *Deleted

## 2016-12-29 VITALS — BP 120/68 | HR 80 | Ht 61.0 in | Wt 184.0 lb

## 2016-12-29 DIAGNOSIS — R1310 Dysphagia, unspecified: Secondary | ICD-10-CM | POA: Diagnosis not present

## 2016-12-29 DIAGNOSIS — Z1211 Encounter for screening for malignant neoplasm of colon: Secondary | ICD-10-CM

## 2016-12-29 DIAGNOSIS — K219 Gastro-esophageal reflux disease without esophagitis: Secondary | ICD-10-CM

## 2016-12-29 MED ORDER — NA SULFATE-K SULFATE-MG SULF 17.5-3.13-1.6 GM/177ML PO SOLN: 1.0000 | Freq: Once | ORAL | 0 refills | Status: AC

## 2016-12-29 NOTE — Telephone Encounter (Signed)
  12/29/2016   RE: Lisa Porter DOB: June 08, 1940 MRN: [id number]   Dear Virgina Jock    We have scheduled the above patient for an endoscopic procedure. Our records show that she is on anticoagulation therapy.   Please advise as to how long the patient may come off her therapy of Plavix prior to the procedure, which is scheduled for 01/27/2017.  Please fax back/ or route the completed form to [facility] at [phone].   Sincerely,    Genella Mech ,CMA AAMA

## 2016-12-29 NOTE — Progress Notes (Signed)
Lisa Porter    [id number]    Mar 05, 1941  Primary Care Physician:Russo, Jenny Reichmann, MD  Referring Physician: Shon Baton, MD [address] [place], [place] [address]  Chief complaint:  GERD, dysphagia, colorectal cancer screening  HPI: 44 yr F remotely followed by Dr Olevia Perches is here to discuss colorectal cancer screening. Last colonoscopy in Oct 2008 showed left-sided diverticulosis otherwise normal exam. Patient has chronic intermittent dysphagia to solids, worse with meat and bread, can swallow better if she chews well. She stands up when she takes pills. Large pills she chews them and takes them one at a time. She regurgitates food back up rarely and no history of food impactions. Has chronic heartburn and reflux symptoms, controlled with Zantac. No history of weight loss. Denies any nausea, vomiting, abdominal pain, melena or bright red blood per rectum    Outpatient Encounter Prescriptions as of 12/29/2016  Medication Sig  . acetaminophen (TYLENOL) 500 MG tablet Take 500 mg by mouth every morning.  Marland Kitchen acetaminophen (TYLENOL) 650 MG CR tablet Take 650 mg by mouth at bedtime.  Marland Kitchen aspirin EC 81 MG tablet Take 81 mg by mouth at bedtime.  Marland Kitchen atorvastatin (LIPITOR) 80 MG tablet Take 80 mg by mouth at bedtime.  Marland Kitchen CINNAMON PO Take 1,000 mg by mouth 2 (two) times daily.  . clopidogrel (PLAVIX) 75 MG tablet Take 75 mg by mouth at bedtime. States Will stop 01/20/13  . fluticasone (FLONASE) 50 MCG/ACT nasal spray Place 1 spray into the nose 2 (two) times daily as needed.   Marland Kitchen guaiFENesin (MUCINEX) 600 MG 12 hr tablet Take 1,200 mg by mouth 2 (two) times daily as needed.   . Multiple Vitamin (MULTIVITAMIN WITH MINERALS) TABS tablet Take 1 tablet by mouth at bedtime.  Marland Kitchen OVER THE COUNTER MEDICATION Take 1 tablet by mouth 2 (two) times daily. Glucosamine Chondroitin 1500mg /1200mg   . phenylephrine (NEO-SYNEPHRINE) 1 % nasal spray Place 1 drop into both nostrils every 6 (six) hours as needed for  congestion.  . quinapril (ACCUPRIL) 40 MG tablet Take 40 mg by mouth at bedtime.  . quinapril-hydrochlorothiazide (ACCURETIC) 20-12.5 MG per tablet Take 1 tablet by mouth every morning.  . ranitidine (ZANTAC) 150 MG tablet Take 150 mg by mouth 2 (two) times daily as needed for heartburn.  . vitamin B-12 (CYANOCOBALAMIN) 500 MCG tablet Take 500 mcg by mouth every morning.  . vitamin C (ASCORBIC ACID) 500 MG tablet Take 500 mg by mouth 2 (two) times daily.  . [DISCONTINUED] balanced salts SOLN 500 mL with phenylephrine 1%/ketorolac 0.3% 1-0.3 % SOLN 4 mL Apply to eye.  . [DISCONTINUED] Calcium Carb-Cholecalciferol (CALCIUM 600 + D PO) Take 1 tablet by mouth every morning.  . [DISCONTINUED] HYDROcodone-acetaminophen (NORCO) 5-325 MG per tablet Take 1-2 tablets by mouth every 6 (six) hours as needed for moderate pain or severe pain. (Patient not taking: Reported on 11/25/2016)  . [DISCONTINUED] traMADol (ULTRAM) 50 MG tablet Take 2 tablets (100 mg total) by mouth every 6 (six) hours as needed for moderate pain. (Patient not taking: Reported on 11/25/2016)   No facility-administered encounter medications on file as of 12/29/2016.     Allergies as of 12/29/2016 - Review Complete 12/29/2016  Allergen Reaction Noted  . Tape Other (See Comments) 01/13/2013  . Sulfa antibiotics Rash 01/13/2013    Past Medical History:  Diagnosis Date  . Arthritis   . GERD (gastroesophageal reflux disease)   . Hyperlipemia   . Hypertension   .  Plantar fasciitis of right foot   . Stroke [facility]) 2000   no defecits/  TIA  2012    Past Surgical History:  Procedure Laterality Date  . BREAST SURGERY Right    biopsy x 3  . CHOLECYSTECTOMY    . EYE SURGERY Left    cataract extraction with IOL  . 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 Heart failure Mother   . Hypertension Father   . CAD Brother   . Hypertension Brother   . Melanoma Sister        leg  . Colon cancer Neg Hx   . Esophageal cancer Neg Hx   . Stomach cancer Neg Hx     Social History   Social History  . Marital status: Married    Spouse name: N/A  . Number of children: N/A  . Years of education: N/A   Occupational History  . Not on file.   Social History Main Topics  . Smoking status: Never Smoker  . Smokeless tobacco: Never Used  . Alcohol use No  . Drug use: No  . Sexual activity: Not on file   Other Topics Concern  . Not on file   Social History Narrative  . No narrative on file      Review of systems: Review of Systems  Constitutional: Negative for fever and chills.  HENT: Negative.   Eyes: Negative for blurred vision.  Respiratory: Negative for cough, shortness of breath and wheezing.   Cardiovascular: Negative for chest pain and palpitations.  Gastrointestinal: as per HPI Genitourinary: Negative for dysuria, urgency, frequency and hematuria.  Musculoskeletal: Negative for myalgias, back pain and joint pain.  Skin: Negative for itching and rash.  Neurological: Negative for dizziness, tremors, focal weakness, seizures and loss of consciousness.  Endo/Heme/Allergies: Positive for seasonal allergies.  Psychiatric/Behavioral: Negative for depression, suicidal ideas and hallucinations.  All other systems reviewed and are negative.   Physical Exam: Vitals:   12/29/16 1312  BP: 120/68  Pulse: 80   Body mass index is 34.77 kg/m. Gen:      No acute distress HEENT:  EOMI, sclera anicteric Neck:     No masses; no thyromegaly Lungs:    Clear to auscultation bilaterally; normal respiratory effort CV:         Regular rate and rhythm; no murmurs Abd:      + bowel sounds; soft, non-tender; no palpable masses, no distension Ext:    No edema; adequate peripheral perfusion Skin:      Warm and dry; no rash Neuro:  alert and oriented x 3 Psych: normal mood and affect  Data Reviewed:  Reviewed labs, radiology imaging, old records and pertinent past GI work up   Assessment and Plan/Recommendations:  76 year old female with history of chronic GERD, chronic intermittent solid dysphagia  Dysphagia: Patient has had chronic symptoms and she has modified her lifestyle to adapt well Patient doesn't feel she needs to undergo EGD for evaluation /possible dilation at this point We'll obtain barium esophagram to evaluate for possible strictures, mass lesions or motility disorder  GERD: Continue antireflux measures Zantac  Colorectal cancer screening: Average risk Patient wants to proceed with colonoscopy for evaluation The risks and benefits as well as alternatives of endoscopic procedure(s) have been discussed and reviewed. All questions answered.  The patient agrees to proceed.  Return as needed  K. Denzil Magnuson , MD [phone] Mon-Fri 8a-5p [phone] after 5p, weekends, holidays  CC: Shon Baton, MD

## 2016-12-29 NOTE — Patient Instructions (Addendum)
You have been scheduled for a Barium Esophogram at [facility] (1st floor of the hospital) on 10/26/2018at 1:30P,. Please arrive 15 minutes prior to your appointment for registration. Make certain not to have anything to eat or drink 6 hours prior to your test. If you need to reschedule for any reason, please contact radiology at [phone] to do so. __________________________________________________________________ A barium swallow is an examination that concentrates on views of the esophagus. This tends to be a double contrast exam (barium and two liquids which, when combined, create a gas to distend the wall of the oesophagus) or single contrast (non-ionic iodine based). The study is usually tailored to your symptoms so a good history is essential. Attention is paid during the study to the form, structure and configuration of the esophagus, looking for functional disorders (such as aspiration, dysphagia, achalasia, motility and reflux) EXAMINATION You may be asked to change into a gown, depending on the type of swallow being performed. A radiologist and radiographer will perform the procedure. The radiologist will advise you of the type of contrast selected for your procedure and direct you during the exam. You will be asked to stand, sit or lie in several different positions and to hold a small amount of fluid in your mouth before being asked to swallow while the imaging is performed .In some instances you may be asked to swallow barium coated marshmallows to assess the motility of a solid food bolus. The exam can be recorded as a digital or video fluoroscopy procedure. POST PROCEDURE It will take 1-2 days for the barium to pass through your system. To facilitate this, it is important, unless otherwise directed, to increase your fluids for the next 24-48hrs and to resume your normal diet.  This test typically takes about 30 minutes to  perform. _________________________________________________________________________________  Lisa Porter have been scheduled for a colonoscopy. Please follow written instructions given to you at your visit today.  Please pick up your prep supplies at the pharmacy within the next 1-3 days. If you use inhalers (even only as needed), please bring them with you on the day of your procedure. Your physician has requested that you go to www.startemmi.com and enter the access code given to you at your visit today. This web site gives a general overview about your procedure. However, you should still follow specific instructions given to you by our office regarding your preparation for the procedure.  You will be contacted by our office prior to your procedure for directions on holding your Plavix.  If you do not hear from our office 1 week prior to your scheduled procedure, please call [phone] to discuss.

## 2017-01-07 ENCOUNTER — Ambulatory Visit (HOSPITAL_COMMUNITY): Payer: Medicare Other

## 2017-01-07 ENCOUNTER — Ambulatory Visit (HOSPITAL_COMMUNITY)
Admission: RE | Admit: 2017-01-07 | Discharge: 2017-01-07 | Disposition: A | Discharge status: Home / Self Care | Payer: Medicare Other | Source: Ambulatory Visit | Attending: Gastroenterology | Admitting: Gastroenterology

## 2017-01-07 DIAGNOSIS — K449 Diaphragmatic hernia without obstruction or gangrene: Secondary | ICD-10-CM | POA: Diagnosis not present

## 2017-01-07 DIAGNOSIS — R1310 Dysphagia, unspecified: Secondary | ICD-10-CM | POA: Insufficient documentation

## 2017-01-07 DIAGNOSIS — K224 Dyskinesia of esophagus: Secondary | ICD-10-CM | POA: Diagnosis not present

## 2017-01-08 ENCOUNTER — Ambulatory Visit (HOSPITAL_COMMUNITY): Payer: Medicare Other

## 2017-01-13 ENCOUNTER — Encounter: Payer: Self-pay | Admitting: Gastroenterology

## 2017-01-15 ENCOUNTER — Telehealth: Payer: Self-pay | Admitting: Gastroenterology

## 2017-01-15 NOTE — Telephone Encounter (Signed)
Patient requests a copy of her report. Mailed to patient per her request.

## 2017-01-15 NOTE — Telephone Encounter (Signed)
Patient would like a call for results from Barium swallow done on 10.25.18

## 2017-01-18 NOTE — Telephone Encounter (Signed)
Ok to hold per fax back from Dr Virgina Jock, will send to be scanned in  Per Dr Virgina Jock ok to hold 5 days before procedure and restart 1-5 days after depending on findings  Per Dr Virgina Jock

## 2017-01-18 NOTE — Telephone Encounter (Signed)
Called Dr Keane Police office regarding patients plavix  I still have not heard from him after 2 faxes to his office.. L/M for nurse Caryl Pina to contact me as soon as possible.. Patients colon is scheduled for 01/27/2017

## 2017-01-27 ENCOUNTER — Encounter: Payer: Self-pay | Admitting: Gastroenterology

## 2017-01-27 ENCOUNTER — Ambulatory Visit (AMBULATORY_SURGERY_CENTER): Payer: Medicare Other | Admitting: Gastroenterology

## 2017-01-27 VITALS — BP 145/101 | HR 75 | Temp 97.1°F | Resp 12 | Ht 61.0 in | Wt 184.0 lb

## 2017-01-27 DIAGNOSIS — Z1212 Encounter for screening for malignant neoplasm of rectum: Secondary | ICD-10-CM | POA: Diagnosis not present

## 2017-01-27 DIAGNOSIS — Z1211 Encounter for screening for malignant neoplasm of colon: Secondary | ICD-10-CM

## 2017-01-27 DIAGNOSIS — D122 Benign neoplasm of ascending colon: Secondary | ICD-10-CM

## 2017-01-27 MED ORDER — SODIUM CHLORIDE 0.9 % IV SOLN: 500.0000 mL | INTRAVENOUS | Status: DC

## 2017-01-27 NOTE — Progress Notes (Signed)
Pt's states no medical or surgical changes since previsit or office visit. 

## 2017-01-27 NOTE — Progress Notes (Signed)
To PACU, VSS. Report to [email or web address] 

## 2017-01-27 NOTE — Patient Instructions (Signed)
YOU HAD AN ENDOSCOPIC PROCEDURE TODAY AT [facility]:   Refer to the procedure report that was given to you for any specific questions about what was found during the examination.  If the procedure report does not answer your questions, please call your gastroenterologist to clarify.  If you requested that your care partner not be given the details of your procedure findings, then the procedure report has been included in a sealed envelope for you to review at your convenience later.  YOU SHOULD EXPECT: Some feelings of bloating in the abdomen. Passage of more gas than usual.  Walking can help get rid of the air that was put into your GI tract during the procedure and reduce the bloating. If you had a lower endoscopy (such as a colonoscopy or flexible sigmoidoscopy) you may notice spotting of blood in your stool or on the toilet paper. If you underwent a bowel prep for your procedure, you may not have a normal bowel movement for a few days.  Please Note:  You might notice some irritation and congestion in your nose or some drainage.  This is from the oxygen used during your procedure.  There is no need for concern and it should clear up in a day or so.  SYMPTOMS TO REPORT IMMEDIATELY:   Following lower endoscopy (colonoscopy or flexible sigmoidoscopy):  Excessive amounts of blood in the stool  Significant tenderness or worsening of abdominal pains  Swelling of the abdomen that is new, acute  Fever of 100F or higher  For urgent or emergent issues, a gastroenterologist can be reached at any hour by calling [phone].  DIET:  We do recommend a small meal at first, but then you may proceed to your regular diet.  Drink plenty of fluids but you should avoid alcoholic beverages for 24 hours.  ACTIVITY:  You should plan to take it easy for the rest of today and you should NOT DRIVE or use heavy machinery until tomorrow (because of the sedation medicines used during the test).     FOLLOW UP: Our staff will call the number listed on your records the next business day following your procedure to check on you and address any questions or concerns that you may have regarding the information given to you following your procedure. If we do not reach you, we will leave a message.  However, if you are feeling well and you are not experiencing any problems, there is no need to return our call.  We will assume that you have returned to your regular daily activities without incident.  If any biopsies were taken you will be contacted by phone or by letter within the next 1-3 weeks.  Please call us at [phone] if you have not heard about the biopsies in 3 weeks.   SIGNATURES/CONFIDENTIALITY: You and/or your care partner have signed paperwork which will be entered into your electronic medical record.  These signatures attest to the fact that that the information above on your After Visit Summary has been reviewed and is understood.  Full responsibility of the confidentiality of this discharge information lies with you and/or your care-partner.  Continue your normal medications- resume Plavix tomorrow  Please read over handouts about polyps, diverticulosis and hemorrhoids  Await pathology

## 2017-01-27 NOTE — Progress Notes (Signed)
Called to room to assist during endoscopic procedure.  Patient ID and intended procedure confirmed with present staff. Received instructions for my participation in the procedure from the performing physician.  

## 2017-01-27 NOTE — Op Note (Addendum)
[facility] Patient Name: Lisa Porter Procedure Date: 01/27/2017 1:34 PM MRN: [id number] Endoscopist: Mauri Pole , MD Age: 76 Referring MD:  Date of Birth: 1940/03/23 Gender: Female Account #: [id number] Procedure:                Colonoscopy Indications:              Screening for colorectal malignant neoplasm Medicines:                Monitored Anesthesia Care Procedure:                Pre-Anesthesia Assessment:                           - Prior to the procedure, a History and Physical                            was performed, and patient medications and                            allergies were reviewed. The patient's tolerance of                            previous anesthesia was also reviewed. The risks                            and benefits of the procedure and the sedation                            options and risks were discussed with the patient.                            All questions were answered, and informed consent                            was obtained. Prior Anticoagulants: The patient                            last took Plavix (clopidogrel) 5 days prior to the                            procedure. ASA Grade Assessment: II - A patient                            with mild systemic disease. After reviewing the                            risks and benefits, the patient was deemed in                            satisfactory condition to undergo the procedure.                           After obtaining informed consent, the colonoscope  was passed under direct vision. Throughout the                            procedure, the patient's blood pressure, pulse, and                            oxygen saturations were monitored continuously. The                            Model PCF-H190DL [id number]) scope was introduced                            through the anus and advanced to the the cecum,                            identified  by appendiceal orifice and ileocecal                            valve. The colonoscopy was performed without                            difficulty. The patient tolerated the procedure                            well. The quality of the bowel preparation was                            adequate. The ileocecal valve, appendiceal orifice,                            and rectum were photographed. Scope In: 1:39:59 PM Scope Out: 2:00:16 PM Scope Withdrawal Time: 0 hours 12 minutes 44 seconds  Total Procedure Duration: 0 hours 20 minutes 17 seconds  Findings:                 The perianal and digital rectal examinations were                            normal.                           A 7 mm polyp was found in the ascending colon. The                            polyp was sessile. The polyp was removed with a                            cold snare. Resection and retrieval were complete.                           Scattered small and large-mouthed diverticula were                            found in the sigmoid colon, descending colon and  transverse colon.                           Non-bleeding internal hemorrhoids were found during                            retroflexion. The hemorrhoids were small. Complications:            No immediate complications. Estimated Blood Loss:     Estimated blood loss was minimal. Impression:               - One 7 mm polyp in the ascending colon, removed                            with a cold snare. Resected and retrieved.                           - Diverticulosis in the sigmoid colon, in the                            descending colon and in the transverse colon.                           - Non-bleeding internal hemorrhoids. Recommendation:           - Patient has a contact number available for                            emergencies. The signs and symptoms of potential                            delayed complications were discussed with the                             patient. Return to normal activities tomorrow.                            Written discharge instructions were provided to the                            patient.                           - Resume previous diet.                           - Continue present medications.                           - Await pathology results.                           - Repeat colonoscopy date to be determined after                            pending pathology results are reviewed for  surveillance based on pathology results.                           - Resume Plavix (clopidogrel) at prior dose                            tomorrow. Refer to managing physician for further                            adjustment of therapy. Mauri Pole, MD 01/27/2017 2:06:29 PM This report has been signed electronically.

## 2017-01-28 ENCOUNTER — Telehealth: Payer: Self-pay

## 2017-01-28 NOTE — Telephone Encounter (Signed)
  Follow up Call-  Call back number 01/27/2017  Post procedure Call Back phone  # [phone]  Permission to leave phone message Yes  Some recent data might be hidden     Patient questions:  Do you have a fever, pain , or abdominal swelling? No. Pain Score  0 *  Have you tolerated food without any problems? Yes.    Have you been able to return to your normal activities? Yes.    Do you have any questions about your discharge instructions: Diet   No. Medications  No. Follow up visit  No.  Do you have questions or concerns about your Care? No.  Actions: * If pain score is 4 or above: No action needed, pain <4.

## 2017-02-03 ENCOUNTER — Encounter: Payer: Self-pay | Admitting: Gastroenterology

## 2017-02-15 ENCOUNTER — Telehealth: Payer: Self-pay | Admitting: Gastroenterology

## 2017-02-15 NOTE — Telephone Encounter (Signed)
The pt was advised of the letter and was advised the letter will be mailed.    [place] [address]  Dear Ms. Homewood,  The polyp(s) removed were precancerous.  Normally 5 year colonoscopy follow up is recommended.  In general, we stop routine screening  colonoscopy around 80.  Accordingly, we will not contact you to schedule a follow up exam.  Should you have any questions, do not hesitate to contact the office.  Sincerely,  Mauri Pole

## 2017-02-26 ENCOUNTER — Ambulatory Visit: Payer: Medicare Other | Admitting: Podiatry

## 2017-02-26 ENCOUNTER — Encounter: Payer: Self-pay | Admitting: Podiatry

## 2017-02-26 DIAGNOSIS — B351 Tinea unguium: Secondary | ICD-10-CM

## 2017-02-26 DIAGNOSIS — M79675 Pain in left toe(s): Secondary | ICD-10-CM

## 2017-02-26 DIAGNOSIS — M79674 Pain in right toe(s): Secondary | ICD-10-CM

## 2017-02-26 DIAGNOSIS — D689 Coagulation defect, unspecified: Secondary | ICD-10-CM

## 2017-02-26 NOTE — Progress Notes (Signed)
Complaint:  Visit Type: Patient returns to my office for continued preventative foot care services. Complaint: Patient states" my nails have grown long and thick and become painful to walk and wear shoes" Patient is taking plavix.  The patient presents for preventative foot care services. No changes to ROS  Podiatric Exam: Vascular: dorsalis pedis and posterior tibial pulses are palpable bilateral. Capillary return is immediate. Temperature gradient is WNL. Skin turgor WNL  Sensorium: Normal Semmes Weinstein monofilament test. Normal tactile sensation bilaterally. Nail Exam: Pt has thick disfigured discolored nails with subungual debris noted bilateral entire nail hallux through fifth toenails Ulcer Exam: There is no evidence of ulcer or pre-ulcerative changes or infection. Orthopedic Exam: Muscle tone and strength are WNL. No limitations in general ROM. No crepitus or effusions noted. Bone spur second toe right. Skin: No Porokeratosis. No infection or ulcers  Diagnosis:  Onychomycosis, , Pain in right toe, pain in left toes  Treatment & Plan Procedures and Treatment: Consent by patient was obtained for treatment procedures.   Debridement of mycotic and hypertrophic toenails, 1 through 5 bilateral and clearing of subungual debris. No ulceration, no infection noted. Patient to be scheduled in [place]. Return Visit-Office Procedure: Patient instructed to return to the office for a follow up visit 3 months for continued evaluation and treatment.    Gardiner Barefoot DPM

## 2017-03-17 ENCOUNTER — Ambulatory Visit (INDEPENDENT_AMBULATORY_CARE_PROVIDER_SITE_OTHER): Payer: Medicare Other | Admitting: Orthopaedic Surgery

## 2017-03-17 ENCOUNTER — Encounter (INDEPENDENT_AMBULATORY_CARE_PROVIDER_SITE_OTHER): Payer: Self-pay | Admitting: Orthopaedic Surgery

## 2017-03-17 ENCOUNTER — Ambulatory Visit (INDEPENDENT_AMBULATORY_CARE_PROVIDER_SITE_OTHER): Payer: Medicare Other

## 2017-03-17 DIAGNOSIS — M25552 Pain in left hip: Secondary | ICD-10-CM | POA: Diagnosis not present

## 2017-03-17 DIAGNOSIS — M5442 Lumbago with sciatica, left side: Secondary | ICD-10-CM

## 2017-03-17 MED ORDER — METHYLPREDNISOLONE 4 MG PO TABS
ORAL_TABLET | ORAL | 0 refills | Status: DC
Start: 2017-03-17 — End: 2020-01-08

## 2017-03-17 NOTE — Progress Notes (Signed)
Office Visit Note   [name]           Date of Birth: 1941/01/28           MRN: [id number] Visit Date: 03/17/2017              Requested by: Shon Baton, [address], [place] [id number] PCP: Shon Baton, MD   Assessment & Plan: Visit Diagnoses:  1. Pain in left hip   2. Acute left-sided low back pain with left-sided sciatica     Plan: Her primary care physician send in Voltaren gel for her which I agreed to give him the fact that she is on Plavix he cannot take traditional anti-inflammatories.  I will also try 6-day steroid taper and recommended outpatient physical therapy at [facility] physical therapy in [place] to work on varus modalities to decrease her back pain and improve her function and hopefully treat the sciatica.  All questions concerns were answered and addressed.  We will see her back in a month see how she is doing overall.  She has not improved we will obtain an MRI of her lumbar spine.  Follow-Up Instructions: Return in about 4 weeks (around 04/14/2017).   Orders:  Orders Placed This Encounter  Procedures  . XR HIP UNILAT W OR W/O PELVIS 1V LEFT  . XR Lumbar Spine 2-3 Views   Meds ordered this encounter  Medications  . methylPREDNISolone (MEDROL) 4 MG tablet    Sig: Medrol dose pack. Take as instructed    Dispense:  21 tablet    Refill:  0      Procedures: No procedures performed   Clinical Data: No additional findings.   Subjective: Chief Complaint  Patient presents with  . Left Hip - Pain  Patient comes in today with left-sided sciatica and low back pain that radiates down to her knee.  She said the similarly on the right side before.  She is on Plavix and cannot take anti-inflammatories.  Her primary care physician put her on some Voltaren gel which has helped some.  She denies any groin pain.  She does point of the trochanteric area some of her pain but mainly is in the low back radiating to behind her knee.  She denies  any saddle anesthesia.  She denies any radicular symptoms beyond the knee on the left side.  She denies any change in bowel bladder function.  HPI  Review of Systems She currently denies any headache, chest pain, shortness of breath, fever, chills, nausea, vomiting  Objective: Vital Signs: There were no vitals taken for this visit.  Physical Exam She is alert and oriented x3 and in no acute distress Ortho Exam Examination of her low back shows pain with flexion extension low back.  There is pain on traction sciatic nerve on the left side.  Her hip knee and ankle function is normal otherwise.  She is got good strength throughout her right and left lower extremities. Specialty Comments:  No specialty comments available.  Imaging: Xr Hip Unilat W Or W/o Pelvis 1v Left  Result Date: 03/17/2017 An AP pelvis and a lateral of the left hip shows no significant arthritic changes in the hip at all.  There is some slight spurring around the trochanteric area but the hip joints well maintained with no acute findings.  Xr Lumbar Spine 2-3 Views  Result Date: 03/17/2017 2 views of the lumbar spine shows significant arthritic changes throughout the lumbar spine with degenerative  spondylolisthesis at several levels as well as disc space narrowing.  There is also foraminal stenosis and posterior arthritic changes.    PMFS History: Patient Active Problem List   Diagnosis Date Noted  . Arthritis of knee, right 01/27/2013  . HYPERLIPIDEMIA 06/16/2007  . ANXIETY 06/16/2007  . HYPERTENSION 06/16/2007  . HEMORRHOIDS 06/16/2007  . DIVERTICULOSIS, COLON 06/16/2007  . ARTHRITIS 06/16/2007  . CEREBROVASCULAR ACCIDENT, HX OF 06/16/2007   Past Medical History:  Diagnosis Date  . Arthritis   . GERD (gastroesophageal reflux disease)   . Hyperlipemia   . Hypertension   . Plantar fasciitis of right foot   . Stroke [facility]) 2000   no defecits/  TIA  2012    Family History  Problem Relation Age of Onset    . Heart failure Mother   . Hypertension Father   . CAD Brother   . Hypertension Brother   . Melanoma Sister        leg  . Colon cancer Neg Hx   . Esophageal cancer Neg Hx   . Stomach cancer Neg Hx     Past Surgical History:  Procedure Laterality Date  . BREAST SURGERY Right    biopsy x 3  . CHOLECYSTECTOMY    . EYE SURGERY Left    cataract extraction with IOL  . TOTAL KNEE ARTHROPLASTY Right 01/27/2013   Procedure: RIGHT TOTAL KNEE ARTHROPLASTY;  Surgeon: Mcarthur Rossetti, MD;  Location: [facility];  Service: Orthopedics;  Laterality: Right;   Social History   Occupational History  . Not on file  Tobacco Use  . Smoking status: Never Smoker  . Smokeless tobacco: Never Used  Substance and Sexual Activity  . Alcohol use: No  . Drug use: No  . Sexual activity: Not on file

## 2017-04-19 DIAGNOSIS — K635 Polyp of colon: Secondary | ICD-10-CM | POA: Insufficient documentation

## 2017-04-19 DIAGNOSIS — C4492 Squamous cell carcinoma of skin, unspecified: Secondary | ICD-10-CM | POA: Insufficient documentation

## 2017-04-21 ENCOUNTER — Ambulatory Visit (INDEPENDENT_AMBULATORY_CARE_PROVIDER_SITE_OTHER): Payer: Medicare Other | Admitting: Orthopaedic Surgery

## 2017-04-21 ENCOUNTER — Encounter (INDEPENDENT_AMBULATORY_CARE_PROVIDER_SITE_OTHER): Payer: Self-pay | Admitting: Orthopaedic Surgery

## 2017-04-21 DIAGNOSIS — M5442 Lumbago with sciatica, left side: Secondary | ICD-10-CM | POA: Insufficient documentation

## 2017-04-21 NOTE — Progress Notes (Signed)
Lisa Porter is here for follow-up of her low back pain to the left side with left-sided sciatica.  We have had her in physical therapy and that has helped quite a bit however she is still having some discomfort and pain she points to the left side of her low back radiating into her sciatic region and all the way down to her foot.  She is not a diabetic.  She denies any change in bowel or bladder function and denies any weakness in her legs.  On exam she does have a positive straight leg raise to the left side.  She has pain in the sciatic distribution in the left side of her lumbar spine.  She has good strength in bilateral lower extremities.  She does have some slight sensory deficits in L4 and L5 on the left.  At this point given the failure of physical therapy and all forms of conservative treatment we would like to obtain an MRI of her lumbar spine to assess for nerve impingement and stenosis in the lumbar spine area and look for pathology that may benefit from an epidural steroid injection or facet joint injection or combination of these.  She is indeed interested in an MRI to see if this can help decrease her pain and improve her mobility and quality of life.  We will see her back in 2 weeks and hopefully MRI will have been obtained and we can go from there.

## 2017-04-23 ENCOUNTER — Other Ambulatory Visit (INDEPENDENT_AMBULATORY_CARE_PROVIDER_SITE_OTHER): Payer: Self-pay

## 2017-04-23 DIAGNOSIS — M4807 Spinal stenosis, lumbosacral region: Secondary | ICD-10-CM

## 2017-05-01 ENCOUNTER — Ambulatory Visit
Admission: RE | Admit: 2017-05-01 | Discharge: 2017-05-01 | Disposition: A | Discharge status: Home / Self Care | Payer: Medicare Other | Source: Ambulatory Visit | Attending: Orthopaedic Surgery | Admitting: Orthopaedic Surgery

## 2017-05-01 DIAGNOSIS — M4807 Spinal stenosis, lumbosacral region: Secondary | ICD-10-CM

## 2017-05-05 ENCOUNTER — Encounter (INDEPENDENT_AMBULATORY_CARE_PROVIDER_SITE_OTHER): Payer: Self-pay | Admitting: Orthopaedic Surgery

## 2017-05-05 ENCOUNTER — Ambulatory Visit (INDEPENDENT_AMBULATORY_CARE_PROVIDER_SITE_OTHER): Payer: Medicare Other | Admitting: Orthopaedic Surgery

## 2017-05-05 ENCOUNTER — Other Ambulatory Visit (INDEPENDENT_AMBULATORY_CARE_PROVIDER_SITE_OTHER): Payer: Self-pay

## 2017-05-05 DIAGNOSIS — G8929 Other chronic pain: Secondary | ICD-10-CM

## 2017-05-05 DIAGNOSIS — M5442 Lumbago with sciatica, left side: Secondary | ICD-10-CM

## 2017-05-05 NOTE — Progress Notes (Signed)
The patient is here for return follow-up after having an MRI of her lumbar spine.  She still complains of low back pain that radiates down her left backside to the back of her knee into the bottom of her foot on the left side.  She is not a diabetic.  She has tried Voltaren gel in these areas as well.  She is been going to physical therapy as well.  On exam she has excellent strength in her bilateral lower extremities.  The only abnormality her physical exam was slight decreased sensation on the bottom of her heel on the left comparing left to the right.  Her reflexes are dull but equal bilaterally.  She has negative straight leg raise bilaterally.  MRI of her lumbar spine shows multilevel degenerative disc disease and significant stenosis at multiple levels.  They do describe at L5-S1 there is likely compression of the S1 nerve root.  I would like to send her to Dr. Ernestina Patches to consider left-sided injection at the L5-S1 area.  In light of her severe stenosis at L1-L2 that in terms of radicular symptoms.  She is someone who is on Plavix and will need to stop Plavix 6 days prior to having her intervention by Dr. Ernestina Patches.  I will see her back in a month.  All questions concerns were answered and addressed.

## 2017-05-25 ENCOUNTER — Ambulatory Visit (INDEPENDENT_AMBULATORY_CARE_PROVIDER_SITE_OTHER): Payer: Medicare Other

## 2017-05-25 ENCOUNTER — Encounter (INDEPENDENT_AMBULATORY_CARE_PROVIDER_SITE_OTHER): Payer: Self-pay | Admitting: Physical Medicine and Rehabilitation

## 2017-05-25 ENCOUNTER — Ambulatory Visit (INDEPENDENT_AMBULATORY_CARE_PROVIDER_SITE_OTHER): Payer: Medicare Other | Admitting: Physical Medicine and Rehabilitation

## 2017-05-25 VITALS — BP 148/75 | HR 77 | Temp 98.2°F

## 2017-05-25 DIAGNOSIS — M5116 Intervertebral disc disorders with radiculopathy, lumbar region: Secondary | ICD-10-CM

## 2017-05-25 DIAGNOSIS — M48062 Spinal stenosis, lumbar region with neurogenic claudication: Secondary | ICD-10-CM

## 2017-05-25 DIAGNOSIS — M5416 Radiculopathy, lumbar region: Secondary | ICD-10-CM

## 2017-05-25 MED ORDER — METHYLPREDNISOLONE ACETATE 80 MG/ML IJ SUSP
80.0000 mg | Freq: Once | INTRAMUSCULAR | Status: AC
  Administered 2017-05-25: 80 mg

## 2017-05-25 NOTE — Patient Instructions (Signed)

## 2017-05-25 NOTE — Progress Notes (Signed)
 .  Numeric Pain Rating Scale and Functional Assessment Average Pain 7   In the last MONTH (on 0-10 scale) has pain interfered with the following?  1. General activity like being  able to carry out your everyday physical activities such as walking, climbing stairs, carrying groceries, or moving a chair?  Rating(7)   +Driver, -BT(stopped plavix 05/18/17), -Dye Allergies.

## 2017-05-26 NOTE — Progress Notes (Signed)
Lisa Porter - 77 y.o. female MRN [id number]  Date of birth: [birth year]  Office Visit Note: Visit Date: 05/25/2017 PCP: Shon Baton, MD Referred by: Shon Baton, MD  Subjective: Chief Complaint  Patient presents with  . Lower Back - Pain  . Right Leg - Pain  . Right Foot - Pain   HPI: [name] is a very pleasant 77 year old female that I have met in the past because I have seen her sister.  She comes in today with her husband who provides some of the history.  She is followed by Dr. Ninfa Linden in our office.  She is failed conservative care and reports left radicular leg pain in a pretty classic S1 distribution.  This is worse with standing and ambulating.  She reported it started back in November.  MRI is reviewed below and unfortunately shows multilevel stenosis from L1-2 down L4-5.  She also has a facet arthropathy at L5-S1 causing lateral recess stenosis and likely impingement of the left S1 nerve root.  Because her symptoms fit more with this were going to start with a left L5-S1 interlaminar injection.  Depending on relief could look at a transforaminal approach at more of the levels of stenosis.  She is currently in physical therapy and has been going twice a week for some time.  She reports it helps a little bit.    ROS Otherwise per HPI.  Assessment & Plan: Visit Diagnoses:  1. Lumbar radiculopathy   2. Spinal stenosis of lumbar region with neurogenic claudication   3. Radiculopathy due to lumbar intervertebral disc disorder     Plan: No additional findings.   Meds & Orders:  Meds ordered this encounter  Medications  . methylPREDNISolone acetate (DEPO-MEDROL) injection 80 mg    Orders Placed This Encounter  Procedures  . XR C-ARM NO REPORT  . Epidural Steroid injection    Follow-up: Return if symptoms worsen or fail to improve.   Procedures: No procedures performed  Lumbar Epidural Steroid Injection - Interlaminar Approach with Fluoroscopic Guidance  Patient:  Lisa Porter      Date of Birth: [birth year] MRN: [id number] PCP: Shon Baton, MD      Visit Date: 05/25/2017   Universal Protocol:     Consent Given By: the patient  Position: PRONE  Additional Comments: Vital signs were monitored before and after the procedure. Patient was prepped and draped in the usual sterile fashion. The correct patient, procedure, and site was verified.   Injection Procedure Details:  Procedure Site One Meds Administered:  Meds ordered this encounter  Medications  . methylPREDNISolone acetate (DEPO-MEDROL) injection 80 mg     Laterality: Left  Location/Site:  L5-S1  Needle size: 20 G  Needle type: Tuohy  Needle Placement: Paramedian epidural  Findings:   -Comments: Excellent flow of contrast into the epidural space.  Procedure Details: Using a paramedian approach from the side mentioned above, the region overlying the inferior lamina was localized under fluoroscopic visualization and the soft tissues overlying this structure were infiltrated with 4 ml. of 1% Lidocaine without Epinephrine. The Tuohy needle was inserted into the epidural space using a paramedian approach.   The epidural space was localized using loss of resistance along with lateral and bi-planar fluoroscopic views.  After negative aspirate for air, blood, and CSF, a 2 ml. volume of Isovue-250 was injected into the epidural space and the flow of contrast was observed. Radiographs were obtained for documentation purposes.    The injectate was administered  into the level noted above.   Additional Comments:  The patient tolerated the procedure well Dressing: Band-Aid    Post-procedure details: Patient was observed during the procedure. Post-procedure instructions were reviewed.  Patient left the clinic in stable condition.   Clinical History: MRI LUMBAR SPINE WITHOUT CONTRAST  TECHNIQUE: Multiplanar, multisequence MR imaging of the lumbar spine was performed. No  intravenous contrast was administered.  COMPARISON:  Lumbar radiographs 11/17/2007  FINDINGS: Segmentation:  Normal  Alignment: 2 mm anterolisthesis L2-3. 5 mm anterolisthesis L3-4. Slight anterolisthesis L4-5.  Vertebrae:  Normal bone marrow.  Negative for fracture or mass.  Conus medullaris and cauda equina: Conus extends to the L1-2 level. Conus and cauda equina appear normal.  Paraspinal and other soft tissues: Negative for mass or adenopathy.  Disc levels:  T12-L1: Mild disc and facet degeneration  L1-2: Advanced disc degeneration with marked progression. Disc space narrowing with fatty endplate changes. Diffuse endplate spurring posteriorly left greater than right. This is compressing the thecal sac with moderate to severe spinal stenosis. Bilateral facet hypertrophy. Moderate foraminal narrowing bilaterally due to spurring. Possible superimposed disc protrusion on the left.  L2-3: Moderate spinal stenosis. Mild anterolisthesis with disc and facet degeneration.  L3-4: 5 mm anterolisthesis. Advanced facet degeneration bilaterally with diffuse disc bulging. Moderate to severe spinal stenosis. Neural foramina patent.  L4-5: Mild anterolisthesis. Advanced facet degeneration. Mild spinal stenosis and mild subarticular stenosis bilaterally  L5-S1: Moderate facet degeneration bilaterally. Subarticular stenosis on the left with impingement of the left S1 nerve root due to spurring.  IMPRESSION: Advanced disc degeneration and spurring L1-2. Prominent osteophyte formation causing moderate to severe spinal stenosis. Possible superimposed disc protrusion on the left  Moderate spinal stenosis L2-3  Moderate to severe spinal stenosis L4-5  Mild spinal stenosis L4-5 with mild subarticular stenosis bilaterally  Subarticular stenosis on the left at L5-S1 with impingement of the left S1 nerve root.   Electronically Signed   By: Franchot Gallo M.D.    On: 05/01/2017 09:13   She reports that  has never smoked. she has never used smokeless tobacco. No results for input(s): HGBA1C, LABURIC in the last 8760 hours.  Objective:  VS:  HT:    WT:   BMI:     BP:(!) 148/75  HR:77bpm  TEMP:98.2 F (36.8 C)(Oral)  RESP:99 % Physical Exam  Ortho Exam Imaging: Xr C-arm No Report  Result Date: 05/25/2017 Please see Notes or Procedures tab for imaging impression.   Past Medical/Family/Surgical/Social History: Medications & Allergies reviewed per EMR, new medications updated. Patient Active Problem List   Diagnosis Date Noted  . Left-sided low back pain with left-sided sciatica 04/21/2017  . Arthritis of knee, right 01/27/2013  . HYPERLIPIDEMIA 06/16/2007  . ANXIETY 06/16/2007  . HYPERTENSION 06/16/2007  . HEMORRHOIDS 06/16/2007  . DIVERTICULOSIS, COLON 06/16/2007  . ARTHRITIS 06/16/2007  . CEREBROVASCULAR ACCIDENT, HX OF 06/16/2007   Past Medical History:  Diagnosis Date  . Arthritis   . GERD (gastroesophageal reflux disease)   . Hyperlipemia   . Hypertension   . Plantar fasciitis of right foot   . Stroke [facility]) 2000   no defecits/  TIA  2012   Family History  Problem Relation Age of Onset  . Heart failure Mother   . Hypertension Father   . CAD Brother   . Hypertension Brother   . Melanoma Sister        leg  . Colon cancer Neg Hx   . Esophageal cancer Neg Hx   .  Stomach cancer Neg Hx    Past Surgical History:  Procedure Laterality Date  . BREAST SURGERY Right    biopsy x 3  . CHOLECYSTECTOMY    . EYE SURGERY Left    cataract extraction with IOL  . TOTAL KNEE ARTHROPLASTY Right 01/27/2013   Procedure: RIGHT TOTAL KNEE ARTHROPLASTY;  Surgeon: Mcarthur Rossetti, MD;  Location: [facility];  Service: Orthopedics;  Laterality: Right;   Social History   Occupational History  . Not on file  Tobacco Use  . Smoking status: Never Smoker  . Smokeless tobacco: Never Used  Substance and Sexual Activity  . Alcohol use:  No  . Drug use: No  . Sexual activity: Not on file

## 2017-05-26 NOTE — Procedures (Signed)
Lumbar Epidural Steroid Injection - Interlaminar Approach with Fluoroscopic Guidance  [name]      Date of Birth: 1941/02/19 MRN: [id number] PCP: Shon Baton, MD      Visit Date: 05/25/2017   Universal Protocol:     Consent Given By: the patient  Position: PRONE  Additional Comments: Vital signs were monitored before and after the procedure. Patient was prepped and draped in the usual sterile fashion. The correct patient, procedure, and site was verified.   Injection Procedure Details:  Procedure Site One Meds Administered:  Meds ordered this encounter  Medications  . methylPREDNISolone acetate (DEPO-MEDROL) injection 80 mg     Laterality: Left  Location/Site:  L5-S1  Needle size: 20 G  Needle type: Tuohy  Needle Placement: Paramedian epidural  Findings:   -Comments: Excellent flow of contrast into the epidural space.  Procedure Details: Using a paramedian approach from the side mentioned above, the region overlying the inferior lamina was localized under fluoroscopic visualization and the soft tissues overlying this structure were infiltrated with 4 ml. of 1% Lidocaine without Epinephrine. The Tuohy needle was inserted into the epidural space using a paramedian approach.   The epidural space was localized using loss of resistance along with lateral and bi-planar fluoroscopic views.  After negative aspirate for air, blood, and CSF, a 2 ml. volume of Isovue-250 was injected into the epidural space and the flow of contrast was observed. Radiographs were obtained for documentation purposes.    The injectate was administered into the level noted above.   Additional Comments:  The patient tolerated the procedure well Dressing: Band-Aid    Post-procedure details: Patient was observed during the procedure. Post-procedure instructions were reviewed.  Patient left the clinic in stable condition.

## 2017-05-31 ENCOUNTER — Encounter: Payer: Self-pay | Admitting: Podiatry

## 2017-05-31 ENCOUNTER — Ambulatory Visit: Payer: Medicare Other | Admitting: Podiatry

## 2017-05-31 DIAGNOSIS — D689 Coagulation defect, unspecified: Secondary | ICD-10-CM

## 2017-05-31 DIAGNOSIS — M79674 Pain in right toe(s): Secondary | ICD-10-CM | POA: Diagnosis not present

## 2017-05-31 DIAGNOSIS — M79675 Pain in left toe(s): Secondary | ICD-10-CM

## 2017-05-31 DIAGNOSIS — B351 Tinea unguium: Secondary | ICD-10-CM

## 2017-05-31 NOTE — Progress Notes (Signed)
Complaint:  Visit Type: Patient returns to my office for continued preventative foot care services. Complaint: Patient states" my nails have grown long and thick and become painful to walk and wear shoes" Patient is taking plavix.  The patient presents for preventative foot care services. No changes to ROS.  Patient is taking plavix.  Podiatric Exam: Vascular: dorsalis pedis and posterior tibial pulses are palpable bilateral. Capillary return is immediate. Temperature gradient is WNL. Skin turgor WNL  Sensorium: Normal Semmes Weinstein monofilament test. Normal tactile sensation bilaterally. Nail Exam: Pt has thick disfigured discolored nails with subungual debris noted bilateral entire nail hallux through fifth toenails Ulcer Exam: There is no evidence of ulcer or pre-ulcerative changes or infection. Orthopedic Exam: Muscle tone and strength are WNL. No limitations in general ROM. No crepitus or effusions noted. Bone spur second toe right. Skin: No Porokeratosis. No infection or ulcers  Diagnosis:  Onychomycosis, , Pain in right toe, pain in left toes  Treatment & Plan Procedures and Treatment: Consent by patient was obtained for treatment procedures.   Debridement of mycotic and hypertrophic toenails, 1 through 5 bilateral and clearing of subungual debris. No ulceration, no infection noted. Patient to be scheduled in [place]. Return Visit-Office Procedure: Patient instructed to return to the office for a follow up visit 3 months for continued evaluation and treatment.    Yobani Schertzer DPM 

## 2017-06-07 ENCOUNTER — Ambulatory Visit (INDEPENDENT_AMBULATORY_CARE_PROVIDER_SITE_OTHER): Payer: Medicare Other | Admitting: Orthopaedic Surgery

## 2017-06-07 ENCOUNTER — Encounter (INDEPENDENT_AMBULATORY_CARE_PROVIDER_SITE_OTHER): Payer: Self-pay | Admitting: Orthopaedic Surgery

## 2017-06-07 DIAGNOSIS — M5442 Lumbago with sciatica, left side: Secondary | ICD-10-CM

## 2017-06-07 DIAGNOSIS — G8929 Other chronic pain: Secondary | ICD-10-CM

## 2017-06-07 NOTE — Progress Notes (Signed)
Lisa Porter is following up after having a left-sided L5-S1 injection by Dr. Ernestina Patches.  She is also going to physical therapy for lumbar spine.  She is 77 years old.  She feels that the injection was helpful.  She still has some on and off pain but she is may be better she thinks.  On exam she tends to get up out of a chair easily and is mobilizing lower extremity in a negative straight leg raise.  Better in my opinion.  She has excellent strength her left  I would like her to continue physical therapy for at least another 4 weeks.  We will see her back in 6 weeks to see how she is doing overall.  We can always consider sending her back to [facility] after then if needed.

## 2017-07-14 ENCOUNTER — Ambulatory Visit (INDEPENDENT_AMBULATORY_CARE_PROVIDER_SITE_OTHER): Payer: Medicare Other | Admitting: Orthopaedic Surgery

## 2017-07-26 ENCOUNTER — Ambulatory Visit (INDEPENDENT_AMBULATORY_CARE_PROVIDER_SITE_OTHER): Payer: Medicare Other | Admitting: Orthopaedic Surgery

## 2017-07-26 ENCOUNTER — Encounter (INDEPENDENT_AMBULATORY_CARE_PROVIDER_SITE_OTHER): Payer: Self-pay | Admitting: Orthopaedic Surgery

## 2017-07-26 DIAGNOSIS — M5442 Lumbago with sciatica, left side: Secondary | ICD-10-CM | POA: Diagnosis not present

## 2017-07-26 DIAGNOSIS — G8929 Other chronic pain: Secondary | ICD-10-CM

## 2017-07-26 NOTE — Progress Notes (Signed)
Lisa Porter is here following up after having a left-sided L5-S1 injection by Dr. Ernestina Patches back in March.  She says that is helped somewhat but she still having her left-sided sciatica symptoms.  She does not have any change in bowel bladder function or weakness in her legs.  She is active as she can be at 89 but is using a cane to mobilize.  In June she is taking a long car trip to [place] to see family.  She says that she would like to see if Dr. Ernestina Patches would consider a repeat left-sided injection potentially a higher level than L5-S1.  Dr. Ernestina Patches provided this injection in this area before because this seemed to be where her symptoms were.  Her MRI does show significant disease in terms of facet joint disease and anterolisthesis at L3-L4 and significant facet disease at L4-L5.  She also has severe multifactorial spinal stenosis at L1-L2.  Of note she is also been to physical therapy.  At this point I do feel that having her seen by Dr. Ernestina Patches again for repeat left-sided injection and potentially higher than L5-S1 is reasonable.  We will work on getting this scheduled.

## 2017-07-29 ENCOUNTER — Other Ambulatory Visit (INDEPENDENT_AMBULATORY_CARE_PROVIDER_SITE_OTHER): Payer: Self-pay

## 2017-07-29 DIAGNOSIS — M5442 Lumbago with sciatica, left side: Principal | ICD-10-CM

## 2017-07-29 DIAGNOSIS — G8929 Other chronic pain: Secondary | ICD-10-CM

## 2017-08-23 ENCOUNTER — Telehealth (INDEPENDENT_AMBULATORY_CARE_PROVIDER_SITE_OTHER): Payer: Self-pay | Admitting: Orthopaedic Surgery

## 2017-08-23 NOTE — Telephone Encounter (Signed)
Mid July is fine

## 2017-08-23 NOTE — Telephone Encounter (Signed)
Patient called wanting to know when she would come back to see Dr. Ninfa Linden after her injection with Dr. Ernestina Patches. CB # [phone]

## 2017-09-06 ENCOUNTER — Ambulatory Visit: Payer: Medicare Other | Admitting: Podiatry

## 2017-09-06 ENCOUNTER — Encounter: Payer: Self-pay | Admitting: Podiatry

## 2017-09-06 DIAGNOSIS — M79675 Pain in left toe(s): Secondary | ICD-10-CM

## 2017-09-06 DIAGNOSIS — D689 Coagulation defect, unspecified: Secondary | ICD-10-CM

## 2017-09-06 DIAGNOSIS — B351 Tinea unguium: Secondary | ICD-10-CM

## 2017-09-06 DIAGNOSIS — M79674 Pain in right toe(s): Secondary | ICD-10-CM

## 2017-09-06 NOTE — Progress Notes (Signed)
Complaint:  Visit Type: Patient returns to my office for continued preventative foot care services. Complaint: Patient states" my nails have grown long and thick and become painful to walk and wear shoes" Patient is taking plavix.  The patient presents for preventative foot care services. No changes to ROS.  Patient is taking plavix.  Podiatric Exam: Vascular: dorsalis pedis and posterior tibial pulses are palpable bilateral. Capillary return is immediate. Temperature gradient is WNL. Skin turgor WNL  Sensorium: Normal Semmes Weinstein monofilament test. Normal tactile sensation bilaterally. Nail Exam: Pt has thick disfigured discolored nails with subungual debris noted bilateral entire nail hallux through fifth toenails Ulcer Exam: There is no evidence of ulcer or pre-ulcerative changes or infection. Orthopedic Exam: Muscle tone and strength are WNL. No limitations in general ROM. No crepitus or effusions noted. Bone spur second toe right. Skin: No Porokeratosis. No infection or ulcers  Diagnosis:  Onychomycosis, , Pain in right toe, pain in left toes  Treatment & Plan Procedures and Treatment: Consent by patient was obtained for treatment procedures.   Debridement of mycotic and hypertrophic toenails, 1 through 5 bilateral and clearing of subungual debris. No ulceration, no infection noted. Patient to be scheduled in [place]. Return Visit-Office Procedure: Patient instructed to return to the office for a follow up visit 3 months for continued evaluation and treatment.    Gardiner Barefoot DPM

## 2017-09-09 ENCOUNTER — Ambulatory Visit (INDEPENDENT_AMBULATORY_CARE_PROVIDER_SITE_OTHER): Payer: Self-pay

## 2017-09-09 ENCOUNTER — Ambulatory Visit (INDEPENDENT_AMBULATORY_CARE_PROVIDER_SITE_OTHER): Payer: Medicare Other | Admitting: Physical Medicine and Rehabilitation

## 2017-09-09 ENCOUNTER — Encounter (INDEPENDENT_AMBULATORY_CARE_PROVIDER_SITE_OTHER): Payer: Self-pay | Admitting: Physical Medicine and Rehabilitation

## 2017-09-09 VITALS — BP 155/84 | HR 76

## 2017-09-09 DIAGNOSIS — M5416 Radiculopathy, lumbar region: Secondary | ICD-10-CM | POA: Diagnosis not present

## 2017-09-09 MED ORDER — BETAMETHASONE SOD PHOS & ACET 6 (3-3) MG/ML IJ SUSP: 12.0000 mg | Freq: Once | INTRAMUSCULAR | Status: DC

## 2017-09-09 NOTE — Progress Notes (Signed)
 .  Numeric Pain Rating Scale and Functional Assessment Average Pain 6   In the last MONTH (on 0-10 scale) has pain interfered with the following?  1. General activity like being  able to carry out your everyday physical activities such as walking, climbing stairs, carrying groceries, or moving a chair?  Rating(3)   +Driver, -BT, -Dye Allergies.  

## 2017-09-09 NOTE — Patient Instructions (Signed)

## 2017-09-10 NOTE — Procedures (Signed)
Lumbosacral Transforaminal Epidural Steroid Injection - Sub-Pedicular Approach with Fluoroscopic Guidance  [name]      Date of Birth: 17-Jan-1941 MRN: [id number] PCP: Shon Baton, MD      Visit Date: 09/09/2017   Universal Protocol:    Date/Time: 09/09/2017  Consent Given By: the patient  Position: PRONE  Additional Comments: Vital signs were monitored before and after the procedure. Patient was prepped and draped in the usual sterile fashion. The correct patient, procedure, and site was verified.   Injection Procedure Details:  Procedure Site One Meds Administered:  Meds ordered this encounter  Medications  . betamethasone acetate-betamethasone sodium phosphate (CELESTONE) injection 12 mg    Laterality: Left  Location/Site:  L3-L4 S1-2  Needle size: 22 G  Needle type: Spinal  Needle Placement: Transforaminal  Findings:    -Comments: Excellent flow of contrast along the nerve and into the epidural space.  Procedure Details: After squaring off the end-plates to get a true AP view, the C-arm was positioned so that an oblique view of the foramen as noted above was visualized. The target area is just inferior to the "nose of the scotty dog" or sub pedicular. The soft tissues overlying this structure were infiltrated with 2-3 ml. of 1% Lidocaine without Epinephrine.  The spinal needle was inserted toward the target using a "trajectory" view along the fluoroscope beam.  Under AP and lateral visualization, the needle was advanced so it did not puncture dura and was located close the 6 O'Clock position of the pedical in AP tracterory. Biplanar projections were used to confirm position. Aspiration was confirmed to be negative for CSF and/or blood. A 1-2 ml. volume of Isovue-250 was injected and flow of contrast was noted at each level. Radiographs were obtained for documentation purposes.   After attaining the desired flow of contrast documented above, a 0.5 to  1.0 ml test dose of 0.25% Marcaine was injected into each respective transforaminal space.  The patient was observed for 90 seconds post injection.  After no sensory deficits were reported, and normal lower extremity motor function was noted,   the above injectate was administered so that equal amounts of the injectate were placed at each foramen (level) into the transforaminal epidural space.   Additional Comments:  The patient tolerated the procedure well Dressing: Band-Aid    Post-procedure details: Patient was observed during the procedure. Post-procedure instructions were reviewed.  Patient left the clinic in stable condition.

## 2017-09-10 NOTE — Progress Notes (Signed)
Lisa Porter - 77 y.o. female MRN [id number]  Date of birth: 19-Mar-1940  Office Visit Note: Visit Date: 09/09/2017 PCP: Shon Baton, MD Referred by: Shon Baton, MD  Subjective: Chief Complaint  Patient presents with  . Lower Back - Pain  . Left Leg - Pain  . Left Hip - Pain   HPI: [name] is a 77 year old female followed by Dr. Jean Rosenthal.  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  Prior L5-S1 interlaminar injection did not help very much at all.  She continued to have severe lower back pain radiating to the left hip without groin pain is radiating down the left leg.  This is becoming very frustrating for her as it started in November 2018.  She reports difficulty getting in touch with [place] over the last several weeks and is somewhat disappointed that she has had no appointment to get an injection completed for almost 6 weeks.  I did try to help her understand that part of the problem with this had been getting clearance to come off of her blood thinner which is Plavix.  Initially there was trouble with the fax machine going to the office of her cardiologist.  Nonetheless we do have her here today to complete an injection.  I think we should completed L3 transforaminal injection along with an S1 transforaminal injection on the left side.  Hopefully this would get medication both at the level of stenosis but also at the level of the lateral recess at L5.  Her symptoms could be either one.  Unfortunately she may be a surgical candidate although she is on anticoagulation and does have BMI of 34.8.  In the future we can do the transforaminal epidural steroid injection with her on the blood thinner.  The standard of care regarding that has started to change over the last few years and we are doing transforaminal epidural injections while the patient is  still anticoagulated.  There is obviously some higher level of risk incurred but the risk-benefit ratio is probably better than being off the blood thinner.   ROS Otherwise per HPI.  Assessment & Plan: Visit Diagnoses:  1. Lumbar radiculopathy     Plan: No additional findings.   Meds & Orders:  Meds ordered this encounter  Medications  . betamethasone acetate-betamethasone sodium phosphate (CELESTONE) injection 12 mg    Orders Placed This Encounter  Procedures  . XR C-ARM NO REPORT  . Epidural Steroid injection    Follow-up: Return if symptoms worsen or fail to improve.   Procedures: No procedures performed  Lumbosacral Transforaminal Epidural Steroid Injection - Sub-Pedicular Approach with Fluoroscopic Guidance  [name]      Date of Birth: 22-Feb-1941 MRN: [id number] PCP: Shon Baton, MD      Visit Date: 09/09/2017   Universal Protocol:    Date/Time: 09/09/2017  Consent Given By: the patient  Position: PRONE  Additional Comments: Vital signs were monitored before and after the procedure. Patient was prepped and draped in the usual sterile fashion. The correct patient, procedure, and site was verified.   Injection Procedure Details:  Procedure Site One Meds Administered:  Meds ordered this encounter  Medications  . betamethasone acetate-betamethasone sodium phosphate (CELESTONE) injection 12 mg    Laterality: Left  Location/Site:  L3-L4 S1-2  Needle size: 22 G  Needle type: Spinal  Needle  Placement: Transforaminal  Findings:    -Comments: Excellent flow of contrast along the nerve and into the epidural space.  Procedure Details: After squaring off the end-plates to get a true AP view, the C-arm was positioned so that an oblique view of the foramen as noted above was visualized. The target area is just inferior to the "nose of the scotty dog" or sub pedicular. The soft tissues overlying this structure were infiltrated with 2-3 ml. of 1%  Lidocaine without Epinephrine.  The spinal needle was inserted toward the target using a "trajectory" view along the fluoroscope beam.  Under AP and lateral visualization, the needle was advanced so it did not puncture dura and was located close the 6 O'Clock position of the pedical in AP tracterory. Biplanar projections were used to confirm position. Aspiration was confirmed to be negative for CSF and/or blood. A 1-2 ml. volume of Isovue-250 was injected and flow of contrast was noted at each level. Radiographs were obtained for documentation purposes.   After attaining the desired flow of contrast documented above, a 0.5 to 1.0 ml test dose of 0.25% Marcaine was injected into each respective transforaminal space.  The patient was observed for 90 seconds post injection.  After no sensory deficits were reported, and normal lower extremity motor function was noted,   the above injectate was administered so that equal amounts of the injectate were placed at each foramen (level) into the transforaminal epidural space.   Additional Comments:  The patient tolerated the procedure well Dressing: Band-Aid    Post-procedure details: Patient was observed during the procedure. Post-procedure instructions were reviewed.  Patient left the clinic in stable condition.    Clinical History: MRI LUMBAR SPINE WITHOUT CONTRAST  TECHNIQUE: Multiplanar, multisequence MR imaging of the lumbar spine was performed. No intravenous contrast was administered.  COMPARISON:  Lumbar radiographs 11/17/2007  FINDINGS: Segmentation:  Normal  Alignment: 2 mm anterolisthesis L2-3. 5 mm anterolisthesis L3-4. Slight anterolisthesis L4-5.  Vertebrae:  Normal bone marrow.  Negative for fracture or mass.  Conus medullaris and cauda equina: Conus extends to the L1-2 level. Conus and cauda equina appear normal.  Paraspinal and other soft tissues: Negative for mass or adenopathy.  Disc levels:  T12-L1: Mild  disc and facet degeneration  L1-2: Advanced disc degeneration with marked progression. Disc space narrowing with fatty endplate changes. Diffuse endplate spurring posteriorly left greater than right. This is compressing the thecal sac with moderate to severe spinal stenosis. Bilateral facet hypertrophy. Moderate foraminal narrowing bilaterally due to spurring. Possible superimposed disc protrusion on the left.  L2-3: Moderate spinal stenosis. Mild anterolisthesis with disc and facet degeneration.  L3-4: 5 mm anterolisthesis. Advanced facet degeneration bilaterally with diffuse disc bulging. Moderate to severe spinal stenosis. Neural foramina patent.  L4-5: Mild anterolisthesis. Advanced facet degeneration. Mild spinal stenosis and mild subarticular stenosis bilaterally  L5-S1: Moderate facet degeneration bilaterally. Subarticular stenosis on the left with impingement of the left S1 nerve root due to spurring.  IMPRESSION: Advanced disc degeneration and spurring L1-2. Prominent osteophyte formation causing moderate to severe spinal stenosis. Possible superimposed disc protrusion on the left  Moderate spinal stenosis L2-3  Moderate to severe spinal stenosis L4-5  Mild spinal stenosis L4-5 with mild subarticular stenosis bilaterally  Subarticular stenosis on the left at L5-S1 with impingement of the left S1 nerve root.   Electronically Signed   By: Franchot Gallo M.D.   On: 05/01/2017 09:13   She reports that she has never smoked. She has never  used smokeless tobacco. No results for input(s): HGBA1C, LABURIC in the last 8760 hours.  Objective:  VS:  HT:    WT:   BMI:     BP:(!) 155/84  HR:76bpm  TEMP: ( )  RESP:  Physical Exam  Ortho Exam Imaging: Xr C-arm No Report  Result Date: 09/09/2017 Please see Notes tab for imaging impression.   Past Medical/Family/Surgical/Social History: Medications & Allergies reviewed per EMR, new medications  updated. Patient Active Problem List   Diagnosis Date Noted  . Left-sided low back pain with left-sided sciatica 04/21/2017  . Arthritis of knee, right 01/27/2013  . HYPERLIPIDEMIA 06/16/2007  . ANXIETY 06/16/2007  . HYPERTENSION 06/16/2007  . HEMORRHOIDS 06/16/2007  . DIVERTICULOSIS, COLON 06/16/2007  . ARTHRITIS 06/16/2007  . CEREBROVASCULAR ACCIDENT, HX OF 06/16/2007   Past Medical History:  Diagnosis Date  . Arthritis   . GERD (gastroesophageal reflux disease)   . Hyperlipemia   . Hypertension   . Plantar fasciitis of right foot   . Stroke [facility]) 2000   no defecits/  TIA  2012   Family History  Problem Relation Age of Onset  . Heart failure Mother   . Hypertension Father   . CAD Brother   . Hypertension Brother   . Melanoma Sister        leg  . Colon cancer Neg Hx   . Esophageal cancer Neg Hx   . Stomach cancer Neg Hx    Past Surgical History:  Procedure Laterality Date  . BREAST SURGERY Right    biopsy x 3  . CHOLECYSTECTOMY    . EYE SURGERY Left    cataract extraction with IOL  . TOTAL KNEE ARTHROPLASTY Right 01/27/2013   Procedure: RIGHT TOTAL KNEE ARTHROPLASTY;  Surgeon: Mcarthur Rossetti, MD;  Location: [facility];  Service: Orthopedics;  Laterality: Right;   Social History   Occupational History  . Not on file  Tobacco Use  . Smoking status: Never Smoker  . Smokeless tobacco: Never Used  Substance and Sexual Activity  . Alcohol use: No  . Drug use: No  . Sexual activity: Not on file

## 2017-09-23 ENCOUNTER — Telehealth (INDEPENDENT_AMBULATORY_CARE_PROVIDER_SITE_OTHER): Payer: Self-pay | Admitting: Physical Medicine and Rehabilitation

## 2017-09-23 NOTE — Telephone Encounter (Signed)
Left L1 TF or spine surgery referral? F/up Ninfa Linden

## 2017-09-23 NOTE — Telephone Encounter (Signed)
Called pt and scheduled appt for 7/31 with driver.

## 2017-09-23 NOTE — Telephone Encounter (Signed)
Can you call patient to discuss whether she wants injection or spine surgeon referral? Ok to stay stay on plavix for ESI if she wants that.

## 2017-10-13 ENCOUNTER — Ambulatory Visit (INDEPENDENT_AMBULATORY_CARE_PROVIDER_SITE_OTHER): Payer: Self-pay

## 2017-10-13 ENCOUNTER — Ambulatory Visit (INDEPENDENT_AMBULATORY_CARE_PROVIDER_SITE_OTHER): Payer: Medicare Other | Admitting: Physical Medicine and Rehabilitation

## 2017-10-13 ENCOUNTER — Encounter (INDEPENDENT_AMBULATORY_CARE_PROVIDER_SITE_OTHER): Payer: Self-pay | Admitting: Physical Medicine and Rehabilitation

## 2017-10-13 VITALS — BP 177/80 | HR 65

## 2017-10-13 DIAGNOSIS — M5416 Radiculopathy, lumbar region: Secondary | ICD-10-CM

## 2017-10-13 MED ORDER — DEXAMETHASONE SODIUM PHOSPHATE 10 MG/ML IJ SOLN
15.0000 mg | Freq: Once | INTRAMUSCULAR | Status: AC
  Administered 2017-10-13: 15 mg

## 2017-10-13 NOTE — Progress Notes (Signed)
 .  Numeric Pain Rating Scale and Functional Assessment Average Pain 5   In the last MONTH (on 0-10 scale) has pain interfered with the following?  1. General activity like being  able to carry out your everyday physical activities such as walking, climbing stairs, carrying groceries, or moving a chair?  Rating(4)   +Driver, +BT(ok to be on plavix), -Dye Allergies.

## 2017-10-13 NOTE — Patient Instructions (Signed)

## 2017-10-27 ENCOUNTER — Telehealth (INDEPENDENT_AMBULATORY_CARE_PROVIDER_SITE_OTHER): Payer: Self-pay | Admitting: Physical Medicine and Rehabilitation

## 2017-10-27 NOTE — Telephone Encounter (Signed)
Patient wants to think about options and will call back.

## 2017-10-27 NOTE — Telephone Encounter (Signed)
Good PT, short course, muscle relaxer or Gabapentin, but may not help that it is episodic, consult with spine surgeon for the mod/severe stenosis at L1-2 and L3-4.

## 2017-10-28 NOTE — Telephone Encounter (Signed)
Pt states she has an appt on 11/08/17 with Dr. Virgina Jock her PCP and would like to call back once she discuss options with him.

## 2017-11-02 NOTE — Progress Notes (Signed)
Lisa Porter - 77 y.o. female MRN [id number]  Date of birth: 07/18/1940  Office Visit Note: Visit Date: 10/13/2017 PCP: Shon Baton, MD Referred by: Shon Baton, MD  Subjective: Chief Complaint  Patient presents with  . Lower Back - Pain  . Left Leg - Pain   HPI: Lisa Porter is a 77 year old female frustrated with her left radicular hip and leg pain.  Distribution seems to be more of the lower lumbar nerve distribution.  She has been followed by Dr. Jean Rosenthal and failed conservative care.  He did obtain MRI of the lumbar spine.  Unfortunately, this shows multilevel changes from L1-2 down to L5-S1.  There is multifactorial stenosis at several levels.  Initial injection was an L5-S1 interlaminar injection that did not really help much.  We then completed an L3 transforaminal injection at the level of stenosis as well as an S1 transforaminal injection because of small disc protrusion here that could affect the S1 nerve root and those did not seem to help either.  She continues to have 6 out of 10 chronic worsening leg pain.  As a last resort we are going to try left L1 transforaminal epidural injection also had a level of stenosis.  If she does not get much relief with this we probably would need to refer her to Dr. Louanne Skye or Dr. Lorin Mercy for surgical evaluation.   ROS Otherwise per HPI.  Assessment & Plan: Visit Diagnoses:  1. Lumbar radiculopathy     Plan: No additional findings.   Meds & Orders:  Meds ordered this encounter  Medications  . dexamethasone (DECADRON) injection 15 mg    Orders Placed This Encounter  Procedures  . XR C-ARM NO REPORT  . Epidural Steroid injection    Follow-up: Return if symptoms worsen or fail to improve.   Procedures: No procedures performed  Lumbosacral Transforaminal Epidural Steroid Injection - Sub-Pedicular Approach with Fluoroscopic Guidance  [name]      Date of Birth: 01/14/1941 MRN: [id number] PCP: Shon Baton,  MD      Visit Date: 10/13/2017   Universal Protocol:    Date/Time: 10/13/2017  Consent Given By: the patient  Position: PRONE  Additional Comments: Vital signs were monitored before and after the procedure. Patient was prepped and draped in the usual sterile fashion. The correct patient, procedure, and site was verified.   Injection Procedure Details:  Procedure Site One Meds Administered:  Meds ordered this encounter  Medications  . dexamethasone (DECADRON) injection 15 mg    Laterality: Left  Location/Site:  L1-L2  Needle size: 22 G  Needle type: Spinal  Needle Placement: Transforaminal  Findings:    -Comments: Excellent flow of contrast along the nerve and into the epidural space.  Procedure Details: After squaring off the end-plates to get a true AP view, the C-arm was positioned so that an oblique view of the foramen as noted above was visualized. The target area is just inferior to the "nose of the scotty dog" or sub pedicular. The soft tissues overlying this structure were infiltrated with 2-3 ml. of 1% Lidocaine without Epinephrine.  The spinal needle was inserted toward the target using a "trajectory" view along the fluoroscope beam.  Under AP and lateral visualization, the needle was advanced so it did not puncture dura and was located close the 6 O'Clock position of the pedical in AP tracterory. Biplanar projections were used to confirm position. Aspiration was confirmed to be negative for CSF and/or blood. A  1-2 ml. volume of Isovue-250 was injected and flow of contrast was noted at each level. Radiographs were obtained for documentation purposes.   After attaining the desired flow of contrast documented above, a 0.5 to 1.0 ml test dose of 0.25% Marcaine was injected into each respective transforaminal space.  The patient was observed for 90 seconds post injection.  After no sensory deficits were reported, and normal lower extremity motor function was noted,    the above injectate was administered so that equal amounts of the injectate were placed at each foramen (level) into the transforaminal epidural space.   Additional Comments:  The patient tolerated the procedure well Dressing: Band-Aid    Post-procedure details: Patient was observed during the procedure. Post-procedure instructions were reviewed.  Patient left the clinic in stable condition.    Clinical History: MRI LUMBAR SPINE WITHOUT CONTRAST  TECHNIQUE: Multiplanar, multisequence MR imaging of the lumbar spine was performed. No intravenous contrast was administered.  COMPARISON:  Lumbar radiographs 11/17/2007  FINDINGS: Segmentation:  Normal  Alignment: 2 mm anterolisthesis L2-3. 5 mm anterolisthesis L3-4. Slight anterolisthesis L4-5.  Vertebrae:  Normal bone marrow.  Negative for fracture or mass.  Conus medullaris and cauda equina: Conus extends to the L1-2 level. Conus and cauda equina appear normal.  Paraspinal and other soft tissues: Negative for mass or adenopathy.  Disc levels:  T12-L1: Mild disc and facet degeneration  L1-2: Advanced disc degeneration with marked progression. Disc space narrowing with fatty endplate changes. Diffuse endplate spurring posteriorly left greater than right. This is compressing the thecal sac with moderate to severe spinal stenosis. Bilateral facet hypertrophy. Moderate foraminal narrowing bilaterally due to spurring. Possible superimposed disc protrusion on the left.  L2-3: Moderate spinal stenosis. Mild anterolisthesis with disc and facet degeneration.  L3-4: 5 mm anterolisthesis. Advanced facet degeneration bilaterally with diffuse disc bulging. Moderate to severe spinal stenosis. Neural foramina patent.  L4-5: Mild anterolisthesis. Advanced facet degeneration. Mild spinal stenosis and mild subarticular stenosis bilaterally  L5-S1: Moderate facet degeneration bilaterally. Subarticular stenosis on the  left with impingement of the left S1 nerve root due to spurring.  IMPRESSION: Advanced disc degeneration and spurring L1-2. Prominent osteophyte formation causing moderate to severe spinal stenosis. Possible superimposed disc protrusion on the left  Moderate spinal stenosis L2-3  Moderate to severe spinal stenosis L4-5  Mild spinal stenosis L4-5 with mild subarticular stenosis bilaterally  Subarticular stenosis on the left at L5-S1 with impingement of the left S1 nerve root.   Electronically Signed   By: Franchot Gallo M.D.   On: 05/01/2017 09:13   She reports that she has never smoked. She has never used smokeless tobacco. No results for input(s): HGBA1C, LABURIC in the last 8760 hours.  Objective:  VS:  HT:    WT:   BMI:     BP:(!) 177/80  HR:65bpm  TEMP: ( )  RESP:  Physical Exam  Ortho Exam Imaging: No results found.  Past Medical/Family/Surgical/Social History: Medications & Allergies reviewed per EMR, new medications updated. Patient Active Problem List   Diagnosis Date Noted  . Left-sided low back pain with left-sided sciatica 04/21/2017  . Arthritis of knee, right 01/27/2013  . HYPERLIPIDEMIA 06/16/2007  . ANXIETY 06/16/2007  . HYPERTENSION 06/16/2007  . HEMORRHOIDS 06/16/2007  . DIVERTICULOSIS, COLON 06/16/2007  . ARTHRITIS 06/16/2007  . CEREBROVASCULAR ACCIDENT, HX OF 06/16/2007   Past Medical History:  Diagnosis Date  . Arthritis   . GERD (gastroesophageal reflux disease)   . Hyperlipemia   . Hypertension   .  Plantar fasciitis of right foot   . Stroke [facility]) 2000   no defecits/  TIA  2012   Family History  Problem Relation Age of Onset  . Heart failure Mother   . Hypertension Father   . CAD Brother   . Hypertension Brother   . Melanoma Sister        leg  . Colon cancer Neg Hx   . Esophageal cancer Neg Hx   . Stomach cancer Neg Hx    Past Surgical History:  Procedure Laterality Date  . BREAST SURGERY Right    biopsy x 3  .  CHOLECYSTECTOMY    . EYE SURGERY Left    cataract extraction with IOL  . TOTAL KNEE ARTHROPLASTY Right 01/27/2013   Procedure: RIGHT TOTAL KNEE ARTHROPLASTY;  Surgeon: Mcarthur Rossetti, MD;  Location: [facility];  Service: Orthopedics;  Laterality: Right;   Social History   Occupational History  . Not on file  Tobacco Use  . Smoking status: Never Smoker  . Smokeless tobacco: Never Used  Substance and Sexual Activity  . Alcohol use: No  . Drug use: No  . Sexual activity: Not on file

## 2017-11-02 NOTE — Procedures (Signed)
Lumbosacral Transforaminal Epidural Steroid Injection - Sub-Pedicular Approach with Fluoroscopic Guidance  [name]      Date of Birth: 1941-02-14 MRN: [id number] PCP: Shon Baton, MD      Visit Date: 10/13/2017   Universal Protocol:    Date/Time: 10/13/2017  Consent Given By: the patient  Position: PRONE  Additional Comments: Vital signs were monitored before and after the procedure. Patient was prepped and draped in the usual sterile fashion. The correct patient, procedure, and site was verified.   Injection Procedure Details:  Procedure Site One Meds Administered:  Meds ordered this encounter  Medications  . dexamethasone (DECADRON) injection 15 mg    Laterality: Left  Location/Site:  L1-L2  Needle size: 22 G  Needle type: Spinal  Needle Placement: Transforaminal  Findings:    -Comments: Excellent flow of contrast along the nerve and into the epidural space.  Procedure Details: After squaring off the end-plates to get a true AP view, the C-arm was positioned so that an oblique view of the foramen as noted above was visualized. The target area is just inferior to the "nose of the scotty dog" or sub pedicular. The soft tissues overlying this structure were infiltrated with 2-3 ml. of 1% Lidocaine without Epinephrine.  The spinal needle was inserted toward the target using a "trajectory" view along the fluoroscope beam.  Under AP and lateral visualization, the needle was advanced so it did not puncture dura and was located close the 6 O'Clock position of the pedical in AP tracterory. Biplanar projections were used to confirm position. Aspiration was confirmed to be negative for CSF and/or blood. A 1-2 ml. volume of Isovue-250 was injected and flow of contrast was noted at each level. Radiographs were obtained for documentation purposes.   After attaining the desired flow of contrast documented above, a 0.5 to 1.0 ml test dose of 0.25% Marcaine was injected  into each respective transforaminal space.  The patient was observed for 90 seconds post injection.  After no sensory deficits were reported, and normal lower extremity motor function was noted,   the above injectate was administered so that equal amounts of the injectate were placed at each foramen (level) into the transforaminal epidural space.   Additional Comments:  The patient tolerated the procedure well Dressing: Band-Aid    Post-procedure details: Patient was observed during the procedure. Post-procedure instructions were reviewed.  Patient left the clinic in stable condition.

## 2017-11-08 DIAGNOSIS — Z1389 Encounter for screening for other disorder: Secondary | ICD-10-CM | POA: Insufficient documentation

## 2017-11-08 DIAGNOSIS — M48061 Spinal stenosis, lumbar region without neurogenic claudication: Secondary | ICD-10-CM | POA: Insufficient documentation

## 2017-11-23 ENCOUNTER — Encounter: Payer: Self-pay | Admitting: Physical Therapy

## 2017-11-23 ENCOUNTER — Ambulatory Visit: Payer: Medicare Other | Attending: Internal Medicine | Admitting: Physical Therapy

## 2017-11-23 DIAGNOSIS — M5442 Lumbago with sciatica, left side: Secondary | ICD-10-CM | POA: Diagnosis present

## 2017-11-23 DIAGNOSIS — G8929 Other chronic pain: Secondary | ICD-10-CM | POA: Diagnosis present

## 2017-11-23 NOTE — Therapy (Signed)
[place] [address], [place], [address] Phone: [phone]   Fax:  [phone]  Physical Therapy Treatment  Patient Details  Name: Lisa Porter MRN: [id number] Date of Birth: 18-Mar-1940 Referring Provider: Dr. Virgina Jock   Encounter Date: 11/23/2017  PT End of Session - 11/23/17 1600    Visit Number  1    Number of Visits  17    Date for PT Re-Evaluation  01/18/18    PT Start Time  0400    PT Stop Time  0500    PT Time Calculation (min)  60 min    Activity Tolerance  Patient tolerated treatment well    Behavior During Therapy  [facility] for tasks assessed/performed       Past Medical History:  Diagnosis Date  . Arthritis   . GERD (gastroesophageal reflux disease)   . Hyperlipemia   . Hypertension   . Plantar fasciitis of right foot   . Stroke [facility]) 2000   no defecits/  TIA  2012    Past Surgical History:  Procedure Laterality Date  . BREAST SURGERY Right    biopsy x 3  . CHOLECYSTECTOMY    . EYE SURGERY Left    cataract extraction with IOL  . 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  Subjective Assessment - 11/23/17 1606    Subjective  Lumbar spinal stenosis    Pertinent History  Patient is a 77 year old female presenting with left sided LBP last Nov (2018). Patient had Xray imaging and MRI at the beginning of this year which demonstrated spinal stenosis. Patient began physical therapy in Jan of this year until May. Patient reports she stopped PT in May because she did not achieve pain relief. Patient has recieved 3 injections so far, and reports no pain relief with these. Patient reports she has started gabapentin 8/26 and has seen little pain relief from this either. Patient reports she has an appointment with  surgeon 12/22/17 to discuss surgical interventions for stenosis at this point. Patient reports worst pain in the past week 7/10 with prolonged standing/walking; best 1/10 and reports heat improves LBP. Patient ambulates with single point cane since Dec/Jan (since back pain began). Pt denies N/V, B&B changes, unexplained weight fluctuation, saddle paresthesia, fever, night sweats, or unrelenting night pain at this time.    Limitations  House hold activities;Lifting;Standing;Walking    How long can you sit comfortably?  1 hour    How long can you stand comfortably?  5 mins    How long can you walk comfortably?  11min    Diagnostic tests  MRI Jan: Advanced disc degeneration and spurring L1-2. Prominent osteophyte formation causing moderate to severe spinal stenosis. Possible superimposed disc protrusion on the left. Mild anterolisthesis L3-5    Patient Stated Goals  Patient would like to help husband with cooking (be able to stand long enough), return to vacuuming, gardening    Currently in Pain?  Yes    Pain Score  0-No pain    Pain Location  Back    Pain Orientation  Posterior;Lower    Pain Descriptors / Indicators  Cramping;Aching    Pain Type  Chronic pain    Pain Radiating Towards  Down post L hip with no tingling/numbess/burning  Pain Onset  More than a month ago    Pain Frequency  Intermittent    Aggravating Factors   Prolonged standing/walking    Pain Relieving Factors  Heat, gabapentin has helped "some"    Effect of Pain on Daily Activities  Unable to complete ADLs without pain       ROM Lower trunk rotation 50% limited each direction with no pain Standing trunk rotation to the R wnl with "shooting cramp down posterior LLE to the knee", patient denies numbness/tingling/electrical sensation; to the L wnl with no pain  Lumbar flex wnl Lumbar lateral bending limited actively; wnl passively with no pain  Lumbar ext 50% limited, but no overpressure d/t anterolisthesis  Strength Hip  flex: 5/5 bilat Hip abd 4/5 bilat Hip add 4+/5 bilat Hip Ext in prone: 4/5 bilat Hip IR: 5/5 bilat Hip ER: 5/5 bilat     Special Tests/ Other Reflexes diminished bilat (-) slump bilat (-) SLR (-) Crossed SLR (+) Elys  for bilathip flexor tightness (+) 90/90 bilat (+) FABER bilat (+) FADIR bilat with concordant hip pain on L (-) Obers bilat (+) Scour on L hip 5xSTS 13sec with multiple attempts on first try without UE support  Posture: decreased lumbar lordosis, ambulates with decreased hip ext and push off bilat  Ther-Ex - Seated glute stretch x30sec hold (HEP) - Seated hamstring stretch x30sec hold (HEP) - Bridge exercise x10 with cuing for proper form with core contraction to prevent lumbar ext -Education on PT role and there-ex role on pain and strengthening muscles surrounding the spine to increase stability and decrease pain                                    PT Education - 11/23/17 1559    Education Details  Patient was educated on diagnosis, anatomy and pathology involved, prognosis, role of PT, and was given an HEP, demonstrating exercise with proper form following verbal and tactile cues, and was given a paper hand out to continue exercise at home. Pt was educated on and agreed to plan of care.    Person(s) Educated  Patient    Methods  Explanation;Demonstration;Tactile cues;Verbal cues;Handout    Comprehension  Verbalized understanding;Returned demonstration;Verbal cues required;Tactile cues required       PT Short Term Goals - 11/23/17 1649      PT SHORT TERM GOAL #1   Title  Pt will be independent with HEP in order to improve strength, flexibility, and balance in order to decrease fall risk and improve function at home    Time  4    Period  Weeks    Status  New        PT Long Term Goals - 11/23/17 1649      PT LONG TERM GOAL #1   Title  Pt will decrease 5TSTS by at least 3 seconds in order to demonstrate clinically  significant improvement in LE strength    Baseline  11/23/17 13sec    Time  8    Period  Weeks    Status  New      PT LONG TERM GOAL #2   Title  Pt will increase 6MWT by at least 43m (130ft) in order to demonstrate clinically significant improvement in cardiopulmonary and muscular endurance and community ambulation    Baseline  11/23/17 1060ft w/ 5/10 pain following    Time  8    Period  Weeks    Status  New      PT LONG TERM GOAL #3   Title  Pt will decrease worst pain as reported on NPRS by at least 3 points in order to demonstrate clinically significant reduction in pain.    Baseline  11/23/17 7/10    Time  8    Period  Weeks    Status  New      PT LONG TERM GOAL #4   Title  Patient will increase FOTO score to 52 to demonstrate predicted increase in functional mobility to complete ADLs    Baseline  9//10/19 41    Time  8    Period  Weeks    Status  New            Plan - 11/23/17 1826    Clinical Impression Statement  Patient is a 77 year old female with LBP secondary to spinal stenosis. Patient presents today with impairments of decreased core strength, LBP, muscle tension restriction in bilat hips and lumbar paraspinals. Patient with deficits in prolonged walking/standing, sitting, and bending stooping, inhibiting patient's ability to fully participate in her hobbies gardening and playing with her grandchildren.     History and Personal Factors relevant to plan of care:  2 personal factors/comorbidities, 3 body systems/activity limitations/participation restrictions     Clinical Presentation  Evolving    Clinical Decision Making  Moderate    Rehab Potential  Good    Clinical Impairments Affecting Rehab Potential  (-) chroncity of sx, failed conservative PT, age, sedentary lifestyle (+) motivation, social support    PT Frequency  2x / week    PT Duration  8 weeks    PT Treatment/Interventions  ADLs/Self Care Home Management;Electrical Stimulation;Therapeutic  activities;Patient/family education;Taping;Therapeutic exercise;Iontophoresis 4mg /ml Dexamethasone;Aquatic Therapy;Gait training;Balance training;Passive range of motion;Neuromuscular re-education;Stair training;Moist Heat;Traction;Ultrasound;Cryotherapy;Manual techniques;Dry needling    PT Next Visit Plan  Manual techniques for LE, paraspinal tension; core and hip strengthening    PT Home Exercise Plan  Seated glute stretch, seated hamstring stretch, bridge    Consulted and Agree with Plan of Care  Patient       Patient will benefit from skilled therapeutic intervention in order to improve the following deficits and impairments:     Visit Diagnosis: Chronic left-sided low back pain with left-sided sciatica     Problem List Patient Active Problem List   Diagnosis Date Noted  . Left-sided low back pain with left-sided sciatica 04/21/2017  . Arthritis of knee, right 01/27/2013  . HYPERLIPIDEMIA 06/16/2007  . ANXIETY 06/16/2007  . HYPERTENSION 06/16/2007  . HEMORRHOIDS 06/16/2007  . DIVERTICULOSIS, COLON 06/16/2007  . ARTHRITIS 06/16/2007  . CEREBROVASCULAR ACCIDENT, HX OF 06/16/2007   Shelton Silvas PT, DPT Shelton Silvas 11/24/2017, 2:51 PM  [facility] [address], [place], [address] Phone: [phone]   Fax:  [phone]  Name: Lisa Porter MRN: [id number] Date of Birth: [birth year]

## 2017-11-25 ENCOUNTER — Ambulatory Visit: Payer: Medicare Other | Admitting: Physical Therapy

## 2017-11-25 ENCOUNTER — Ambulatory Visit: Payer: Medicare Other

## 2017-11-25 DIAGNOSIS — M5442 Lumbago with sciatica, left side: Secondary | ICD-10-CM | POA: Diagnosis not present

## 2017-11-25 DIAGNOSIS — G8929 Other chronic pain: Secondary | ICD-10-CM

## 2017-11-28 ENCOUNTER — Other Ambulatory Visit: Payer: Self-pay

## 2017-11-28 NOTE — Therapy (Signed)
[place] [facility] SERVICES [address] [place], [place], [address] Phone: [phone]   Fax:  [phone]  Physical Therapy Treatment  Patient Details  Name: Lisa Porter Rigor MRN: [id number] Date of Birth: 03-Aug-1940 Referring Provider: Dr. Virgina Jock   Encounter Date: 11/25/2017  PT End of Session - 11/28/17 0827    Visit Number  2    Number of Visits  17    Date for PT Re-Evaluation  01/18/18    PT Start Time  1110    PT Stop Time  1200    PT Time Calculation (min)  50 min    Activity Tolerance  Patient tolerated treatment well    Behavior During Therapy  [facility] for tasks assessed/performed       Past Medical History:  Diagnosis Date  . Arthritis   . GERD (gastroesophageal reflux disease)   . Hyperlipemia   . Hypertension   . Plantar fasciitis of right foot   . Stroke [facility]) 2000   no defecits/  TIA  2012    Past Surgical History:  Procedure Laterality Date  . BREAST SURGERY Right    biopsy x 3  . CHOLECYSTECTOMY    . EYE SURGERY Left    cataract extraction with IOL  . 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  Subjective Assessment - 11/28/17 0824    Subjective  Pt reports "sciatic" pain on L from posterior hip to foot for 10 months duration; current pain 2-3/10.     Pertinent History  Patient is a 77 year old female presenting with left sided LBP last Nov (2018). Patient had Xray imaging and MRI at the beginning of this year which demonstrated spinal stenosis. Patient began physical therapy in Jan of this year until May. Patient reports she stopped PT in May because she did not achieve pain relief. Patient has recieved 3 injections so far, and reports no pain relief with these. Patient reports she has started gabapentin 8/26 and has seen little  pain relief from this either. Patient reports she has an appointment with surgeon 12/22/17 to discuss surgical interventions for stenosis at this point. Patient reports worst pain in the past week 7/10 with prolonged standing/walking; best 1/10 and reports heat improves LBP. Patient ambulates with single point cane since Dec/Jan (since back pain began). Pt denies N/V, B&B changes, unexplained weight fluctuation, saddle paresthesia, fever, night sweats, or unrelenting night pain at this time.      Ambulation with blue dumbbells  4 L fwd  4 L side    Core stabilization with LE, 20x ea  Hip ab/add  Hip flex/ext  Minisquats  Core stabilization with UE, 20x ea  Sh abd/add  Sh flex/ext  Sh horiz abd/add  Bench, 5 min   Bike   Active stretching, B 3x ea  Ham and gastroc/hip flexor/quad                         PT Education - 11/28/17 0825    Education Details  Properties and benefits of water with exercise, core stabilization, active stretching. postures, core and LE strengthening    Person(s) Educated  Patient    Methods  Explanation;Demonstration;Verbal cues    Comprehension  Verbalized understanding;Returned demonstration;Verbal cues required  PT Short Term Goals - 11/23/17 1649      PT SHORT TERM GOAL #1   Title  Pt will be independent with HEP in order to improve strength, flexibility, and balance in order to decrease fall risk and improve function at home    Time  4    Period  Weeks    Status  New        PT Long Term Goals - 11/23/17 1649      PT LONG TERM GOAL #1   Title  Pt will decrease 5TSTS by at least 3 seconds in order to demonstrate clinically significant improvement in LE strength    Baseline  11/23/17 13sec    Time  8    Period  Weeks    Status  New      PT LONG TERM GOAL #2   Title  Pt will increase 6MWT by at least 36m (135ft) in order to demonstrate clinically significant improvement in cardiopulmonary and muscular endurance and  community ambulation    Baseline  11/23/17 1044ft w/ 5/10 pain following    Time  8    Period  Weeks    Status  New      PT LONG TERM GOAL #3   Title  Pt will decrease worst pain as reported on NPRS by at least 3 points in order to demonstrate clinically significant reduction in pain.    Baseline  11/23/17 7/10    Time  8    Period  Weeks    Status  New      PT LONG TERM GOAL #4   Title  Patient will increase FOTO score to 52 to demonstrate predicted increase in functional mobility to complete ADLs    Baseline  9//10/19 41    Time  8    Period  Weeks    Status  New            Plan - 11/28/17 0102    Clinical Impression Statement  Pt tolerated session well with decrease in pain in LLE; numbness persists. Good understanding of exercises. Continue to progress strength for improved function with decreased pain symptoms.        Patient will benefit from skilled therapeutic intervention in order to improve the following deficits and impairments:     Visit Diagnosis: Chronic left-sided low back pain with left-sided sciatica     Problem List Patient Active Problem List   Diagnosis Date Noted  . Left-sided low back pain with left-sided sciatica 04/21/2017  . Arthritis of knee, right 01/27/2013  . HYPERLIPIDEMIA 06/16/2007  . ANXIETY 06/16/2007  . HYPERTENSION 06/16/2007  . HEMORRHOIDS 06/16/2007  . DIVERTICULOSIS, COLON 06/16/2007  . ARTHRITIS 06/16/2007  . CEREBROVASCULAR ACCIDENT, HX OF 06/16/2007    Lisa Porter 11/28/2017, 8:31 AM  [place] [facility] SERVICES [address] [place], [place], [address] Phone: [phone]   Fax:  [phone]  Name: Lisa Porter MRN: [id number] Date of Birth: 1940/05/02

## 2017-11-30 ENCOUNTER — Ambulatory Visit: Payer: Medicare Other | Admitting: Physical Therapy

## 2017-11-30 DIAGNOSIS — M5442 Lumbago with sciatica, left side: Principal | ICD-10-CM

## 2017-11-30 DIAGNOSIS — G8929 Other chronic pain: Secondary | ICD-10-CM

## 2017-11-30 NOTE — Therapy (Signed)
[place] [facility] [address] [place], [address] Phone: [phone]   Fax:  [phone]  Physical Therapy Treatment  Patient Details  Name: Lisa Porter MRN: [id number] Date of Birth: May 15, 1940 Referring Provider: Dr. Virgina Jock   Encounter Date: 11/30/2017  PT End of Session - 11/30/17 1312    Visit Number  3    Number of Visits  17    Date for PT Re-Evaluation  01/18/18    PT Start Time  0100    PT Stop Time  0145    PT Time Calculation (min)  45 min    Activity Tolerance  Patient tolerated treatment well    Behavior During Therapy  [facility] for tasks assessed/performed       Past Medical History:  Diagnosis Date  . Arthritis   . GERD (gastroesophageal reflux disease)   . Hyperlipemia   . Hypertension   . Plantar fasciitis of right foot   . Stroke [facility]) 2000   no defecits/  TIA  2012    Past Surgical History:  Procedure Laterality Date  . BREAST SURGERY Right    biopsy x 3  . CHOLECYSTECTOMY    . EYE SURGERY Left    cataract extraction with IOL  . 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  Subjective Assessment - 11/30/17 1304    Subjective  Pt says that she "appreciates" PT reccommending aquatic therapy, that she enjoyed it and felt "good" following. Patient reports her LBP has been "managable", but she has had "sciatica sx" in the L post buttock and post thigh. Patient reports minimal compliance with her HEP, and just reports she "needs to get motivated". Patient reports 3/10 pain today in L LB and hip that reports is a dull ache right now. Patient reports heat helps her pain    Pertinent History  Patient is a 77 year old female presenting with left sided LBP last Nov (2018). Patient had Xray imaging and MRI at the  beginning of this year which demonstrated spinal stenosis. Patient began physical therapy in Jan of this year until May. Patient reports she stopped PT in May because she did not achieve pain relief. Patient has recieved 3 injections so far, and reports no pain relief with these. Patient reports she has started gabapentin 8/26 and has seen little pain relief from this either. Patient reports she has an appointment with surgeon 12/22/17 to discuss surgical interventions for stenosis at this point. Patient reports worst pain in the past week 7/10 with prolonged standing/walking; best 1/10 and reports heat improves LBP. Patient ambulates with single point cane since Dec/Jan (since back pain began). Pt denies N/V, B&B changes, unexplained weight fluctuation, saddle paresthesia, fever, night sweats, or unrelenting night pain at this time.    Limitations  House hold activities;Lifting;Standing;Walking    How long can you sit comfortably?  1 hour    How long can you stand comfortably?  5 mins    How long can you walk comfortably?  74min    Diagnostic tests  MRI Jan: Advanced disc degeneration and spurring L1-2. Prominent osteophyte formation causing moderate to severe spinal stenosis. Possible superimposed disc protrusion on the left. Mild anterolisthesis L3-5    Patient Stated Goals  Patient would like to help husband  with cooking (be able to stand long enough), return to vacuuming, gardening    Pain Onset  More than a month ago         Ther-Ex - Bridge 3x 10 with cuing initially for max hip ext with good carry over following - Hooklying clamshell 3x 10 red tband with cuing initially for eccentric control with good carry over following - Posterior pelvic tilt 2x 10 with TC and max VC needed for initial understanding of core contraction with good carry over following - Supine TA marching 3x 10/23/08(each LE) with min cuing to maintain core contraction throughout with good carry over between sets - Review of  hamstring and glute stretch x30sec hold     ESTIM + heat pack Hi[employer] ESTIM 10 min at patient tolerated 210V increased to 120V through treatment at bilat paraspinal area . Attempted to decrease paraspinal tension (L>R). With PT assessing patient tolerance throughout (increasing intensity as needed), monitoring skin integrity (normal), with decreased pain noted from patient. During ESTIM PT educated patient with visual aid on stenosis and it's possible effect on sciatica sx with education on sciatic nerve anatomy and impingement; pt verbalized understanding of all education provided. Following ESTIM, patient reports decreased pain with lumbar rotation                    PT Education - 11/30/17 1311    Education Details  Exercise form; ESTIM education     Person(s) Educated  Patient    Methods  Explanation;Verbal cues    Comprehension  Verbalized understanding;Verbal cues required       PT Short Term Goals - 11/23/17 1649      PT SHORT TERM GOAL #1   Title  Pt will be independent with HEP in order to improve strength, flexibility, and balance in order to decrease fall risk and improve function at home    Time  4    Period  Weeks    Status  New        PT Long Term Goals - 11/23/17 1649      PT LONG TERM GOAL #1   Title  Pt will decrease 5TSTS by at least 3 seconds in order to demonstrate clinically significant improvement in LE strength    Baseline  11/23/17 13sec    Time  8    Period  Weeks    Status  New      PT LONG TERM GOAL #2   Title  Pt will increase 6MWT by at least 83m (146ft) in order to demonstrate clinically significant improvement in cardiopulmonary and muscular endurance and community ambulation    Baseline  11/23/17 1070ft w/ 5/10 pain following    Time  8    Period  Weeks    Status  New      PT LONG TERM GOAL #3   Title  Pt will decrease worst pain as reported on NPRS by at least 3 points in order to demonstrate clinically significant reduction in  pain.    Baseline  11/23/17 7/10    Time  8    Period  Weeks    Status  New      PT LONG TERM GOAL #4   Title  Patient will increase FOTO score to 52 to demonstrate predicted increase in functional mobility to complete ADLs    Baseline  9//10/19 41    Time  8    Period  Weeks    Status  New  Plan - 11/30/17 1322    Clinical Impression Statement  PT led patient through core/hip strengthening which she was able to complete with proper muscle activation and form following cuing. Patient reports minimal pain duing therex that subsides during breaks between sets. PT educated patient on spinal stenosis and sciatica sx with education on sciatic nerve anatomy; patient verbalized understanding of all education provided. Following ESTIM + heat patient has decreased pain with lumbar rotation.     Rehab Potential  Good    Clinical Impairments Affecting Rehab Potential  (-) chroncity of sx, failed conservative PT, age, sedentary lifestyle (+) motivation, social support    PT Frequency  2x / week    PT Duration  8 weeks    PT Treatment/Interventions  ADLs/Self Care Home Management;Electrical Stimulation;Therapeutic activities;Patient/family education;Taping;Therapeutic exercise;Iontophoresis 4mg /ml Dexamethasone;Aquatic Therapy;Gait training;Balance training;Passive range of motion;Neuromuscular re-education;Stair training;Moist Heat;Traction;Ultrasound;Cryotherapy;Manual techniques;Dry needling    PT Next Visit Plan  Manual techniques for LE, paraspinal tension; core and hip strengthening    PT Home Exercise Plan  Seated glute stretch, seated hamstring stretch, bridge    Consulted and Agree with Plan of Care  Patient       Patient will benefit from skilled therapeutic intervention in order to improve the following deficits and impairments:     Visit Diagnosis: Chronic left-sided low back pain with left-sided sciatica     Problem List Patient Active Problem List   Diagnosis Date  Noted  . Left-sided low back pain with left-sided sciatica 04/21/2017  . Arthritis of knee, right 01/27/2013  . HYPERLIPIDEMIA 06/16/2007  . ANXIETY 06/16/2007  . HYPERTENSION 06/16/2007  . HEMORRHOIDS 06/16/2007  . DIVERTICULOSIS, COLON 06/16/2007  . ARTHRITIS 06/16/2007  . CEREBROVASCULAR ACCIDENT, HX OF 06/16/2007   Shelton Silvas PT, DPT Shelton Silvas 11/30/2017, 1:47 PM  [place] [facility] [address] [place], [address] Phone: [phone]   Fax:  [phone]  Name: Seth Friedlander Stinson MRN: [id number] Date of Birth: 1940-08-12

## 2017-12-02 ENCOUNTER — Other Ambulatory Visit: Payer: Self-pay

## 2017-12-02 ENCOUNTER — Ambulatory Visit: Payer: Medicare Other | Admitting: Physical Therapy

## 2017-12-02 ENCOUNTER — Ambulatory Visit: Payer: Medicare Other

## 2017-12-02 DIAGNOSIS — G8929 Other chronic pain: Secondary | ICD-10-CM

## 2017-12-02 DIAGNOSIS — M5442 Lumbago with sciatica, left side: Principal | ICD-10-CM

## 2017-12-02 NOTE — Therapy (Signed)
[place] MAIN [facility] SERVICES [address] [place], [place], [address] Phone: [phone]   Fax:  [phone]  Physical Therapy Treatment  Patient Details  Name: Lisa Porter MRN: [id number] Date of Birth: 07-19-1940 Referring Provider: Dr. Virgina Jock   Encounter Date: 12/02/2017  PT End of Session - 12/02/17 1537    Visit Number  4    Number of Visits  17    Date for PT Re-Evaluation  01/18/18    PT Start Time  4825    PT Stop Time  1355    PT Time Calculation (min)  50 min    Activity Tolerance  Patient tolerated treatment well    Behavior During Therapy  [facility] for tasks assessed/performed       Past Medical History:  Diagnosis Date  . Arthritis   . GERD (gastroesophageal reflux disease)   . Hyperlipemia   . Hypertension   . Plantar fasciitis of right foot   . Stroke [facility]) 2000   no defecits/  TIA  2012    Past Surgical History:  Procedure Laterality Date  . BREAST SURGERY Right    biopsy x 3  . CHOLECYSTECTOMY    . EYE SURGERY Left    cataract extraction with IOL  . 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  Subjective Assessment - 12/02/17 1535    Subjective  Pt reports feeling well post last aquatic session. Pt notes mild discomfort posterior LLE post land session with addition of isometric hamstring exercises, but only for a day (describes as more muscle tightness than pain) Encouraged stretching each day.     Pertinent History  Patient is a 77 year old female presenting with left sided LBP last Nov (2018). Patient had Xray imaging and MRI at the beginning of this year which demonstrated spinal stenosis. Patient began physical therapy in Jan of this year until May. Patient reports she stopped PT in May because she did not achieve pain  relief. Patient has recieved 3 injections so far, and reports no pain relief with these. Patient reports she has started gabapentin 8/26 and has seen little pain relief from this either. Patient reports she has an appointment with surgeon 12/22/17 to discuss surgical interventions for stenosis at this point. Patient reports worst pain in the past week 7/10 with prolonged standing/walking; best 1/10 and reports heat improves LBP. Patient ambulates with single point cane since Dec/Jan (since back pain began). Pt denies N/V, B&B changes, unexplained weight fluctuation, saddle paresthesia, fever, night sweats, or unrelenting night pain at this time.      Ambulation, blue dumbbells  4 L fwd  2 L side  2 L side with minisquat  Gentle resistive ambulation (mild increased speed as well, maintaining proper posture)  Mitts   Fwd with ER arm drag, 2 L   Side with 90 degree elbow hands fwd/thumb up, 2 L   Fwd with fwd resistive arm (grab a barrel), 2 L   Bkwd with resistive arm (breastroke thumb up arms)  Core with UE with Mitts, 20x ea, wall sit position  Sh abd/add  Sh flex/ext  Sh horiz abd/add   Core with LE strengthening, B 20x ea  Hip abd/add  Hip flex/ext  Active stretching, B 3x ea  ham/gastroc and hip flexor/quad  LB R, center, L                       PT Education - 12/02/17 1536    Education Details  Gentle resistive ambulation using Mitts    Person(s) Educated  Patient    Methods  Explanation;Demonstration    Comprehension  Verbalized understanding;Returned demonstration;Verbal cues required       PT Short Term Goals - 11/23/17 1649      PT SHORT TERM GOAL #1   Title  Pt will be independent with HEP in order to improve strength, flexibility, and balance in order to decrease fall risk and improve function at home    Time  4    Period  Weeks    Status  New        PT Long Term Goals - 11/23/17 1649      PT LONG TERM GOAL #1   Title  Pt will decrease  5TSTS by at least 3 seconds in order to demonstrate clinically significant improvement in LE strength    Baseline  11/23/17 13sec    Time  8    Period  Weeks    Status  New      PT LONG TERM GOAL #2   Title  Pt will increase 6MWT by at least 13m (164ft) in order to demonstrate clinically significant improvement in cardiopulmonary and muscular endurance and community ambulation    Baseline  11/23/17 1051ft w/ 5/10 pain following    Time  8    Period  Weeks    Status  New      PT LONG TERM GOAL #3   Title  Pt will decrease worst pain as reported on NPRS by at least 3 points in order to demonstrate clinically significant reduction in pain.    Baseline  11/23/17 7/10    Time  8    Period  Weeks    Status  New      PT LONG TERM GOAL #4   Title  Patient will increase FOTO score to 52 to demonstrate predicted increase in functional mobility to complete ADLs    Baseline  9//10/19 41    Time  8    Period  Weeks    Status  New            Plan - 12/02/17 1538    Clinical Impression Statement  Pt tolerated session well. Encouraged posture and equal step lengths with ambulation and resistive ambulation, which pt was able to do with intermittent cueing. No increased pain or numbness in BLEs. Continue to progress with focus on core strength and LE strength and flexibility    Rehab Potential  Good    Clinical Impairments Affecting Rehab Potential  (-) chroncity of sx, failed conservative PT, age, sedentary lifestyle (+) motivation, social support    PT Frequency  2x / week    PT Duration  8 weeks    PT Treatment/Interventions  ADLs/Self Care Home Management;Electrical Stimulation;Therapeutic activities;Patient/family education;Taping;Therapeutic exercise;Iontophoresis 4mg /ml Dexamethasone;Aquatic Therapy;Gait training;Balance training;Passive range of motion;Neuromuscular re-education;Stair training;Moist Heat;Traction;Ultrasound;Cryotherapy;Manual techniques;Dry needling    PT Next Visit Plan   Manual techniques for LE, paraspinal tension; core and hip strengthening    PT Home Exercise Plan  Seated glute stretch, seated hamstring stretch, bridge    Consulted and Agree with Plan of Care  Patient       Patient will benefit from skilled therapeutic intervention in order to improve the following deficits and  impairments:     Visit Diagnosis: Chronic left-sided low back pain with left-sided sciatica     Problem List Patient Active Problem List   Diagnosis Date Noted  . Left-sided low back pain with left-sided sciatica 04/21/2017  . Arthritis of knee, right 01/27/2013  . HYPERLIPIDEMIA 06/16/2007  . ANXIETY 06/16/2007  . HYPERTENSION 06/16/2007  . HEMORRHOIDS 06/16/2007  . DIVERTICULOSIS, COLON 06/16/2007  . ARTHRITIS 06/16/2007  . CEREBROVASCULAR ACCIDENT, HX OF 06/16/2007    [name] 12/02/2017, 3:41 PM  [place] MAIN [facility] SERVICES [address] [place], [place], [address] Phone: [phone]   Fax:  [phone]  Name: Lisa Porter MRN: [id number] Date of Birth: [birth year]

## 2017-12-06 ENCOUNTER — Ambulatory Visit: Payer: Medicare Other | Admitting: Podiatry

## 2017-12-06 ENCOUNTER — Encounter: Payer: Self-pay | Admitting: Podiatry

## 2017-12-06 DIAGNOSIS — B351 Tinea unguium: Secondary | ICD-10-CM

## 2017-12-06 DIAGNOSIS — D689 Coagulation defect, unspecified: Secondary | ICD-10-CM | POA: Diagnosis not present

## 2017-12-06 DIAGNOSIS — M79674 Pain in right toe(s): Secondary | ICD-10-CM

## 2017-12-06 DIAGNOSIS — M79675 Pain in left toe(s): Secondary | ICD-10-CM

## 2017-12-06 NOTE — Progress Notes (Signed)
Complaint:  Visit Type: Patient returns to my office for continued preventative foot care services. Complaint: Patient states" my nails have grown long and thick and become painful to walk and wear shoes" Patient is taking plavix.  The patient presents for preventative foot care services. No changes to ROS.  Patient is taking plavix.  Podiatric Exam: Vascular: dorsalis pedis and posterior tibial pulses are palpable bilateral. Capillary return is immediate. Temperature gradient is WNL. Skin turgor WNL  Sensorium: Normal Semmes Weinstein monofilament test. Normal tactile sensation bilaterally. Nail Exam: Pt has thick disfigured discolored nails with subungual debris noted bilateral entire nail hallux through fifth toenails Ulcer Exam: There is no evidence of ulcer or pre-ulcerative changes or infection. Orthopedic Exam: Muscle tone and strength are WNL. No limitations in general ROM. No crepitus or effusions noted. Bone spur second toe right. Skin: No Porokeratosis. No infection or ulcers.  Mucoid cyst second toe right foot.  Diagnosis:  Onychomycosis, , Pain in right toe, pain in left toes  Treatment & Plan Procedures and Treatment: Consent by patient was obtained for treatment procedures.   Debridement of mycotic and hypertrophic toenails, 1 through 5 bilateral and clearing of subungual debris. No ulceration, no infection noted. Patient to be scheduled in [place]. Return Visit-Office Procedure: Patient instructed to return to the office for a follow up visit 3 months for continued evaluation and treatment.    Betzabe Bevans DPM 

## 2017-12-07 ENCOUNTER — Ambulatory Visit: Payer: Medicare Other

## 2017-12-07 DIAGNOSIS — M5442 Lumbago with sciatica, left side: Secondary | ICD-10-CM | POA: Diagnosis not present

## 2017-12-07 DIAGNOSIS — G8929 Other chronic pain: Secondary | ICD-10-CM

## 2017-12-07 NOTE — Therapy (Signed)
[place] [facility] [address], [place], [address] Phone: [phone]   Fax:  [phone]  Physical Therapy Treatment  Patient Details  Name: Lisa Porter Armel MRN: [id number] Date of Birth: 04/26/1940 Referring Provider: Dr. Virgina Jock   Encounter Date: 12/07/2017  PT End of Session - 12/07/17 0821    Visit Number  5    Number of Visits  17    Date for PT Re-Evaluation  01/18/18    PT Start Time  0817    PT Stop Time  0900    PT Time Calculation (min)  43 min    Activity Tolerance  Patient tolerated treatment well    Behavior During Therapy  [facility] for tasks assessed/performed       Past Medical History:  Diagnosis Date  . Arthritis   . GERD (gastroesophageal reflux disease)   . Hyperlipemia   . Hypertension   . Plantar fasciitis of right foot   . Stroke [facility]) 2000   no defecits/  TIA  2012    Past Surgical History:  Procedure Laterality Date  . BREAST SURGERY Right    biopsy x 3  . CHOLECYSTECTOMY    . EYE SURGERY Left    cataract extraction with IOL  . 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  Subjective Assessment - 12/07/17 0819    Subjective  Pt complains of some back stiffness today upon arrival. She rates her pain as a 4/10 currently. No specific questions or concerns at this time.     Pertinent History  Patient is a 77 year old female presenting with left sided LBP last Nov (2018). Patient had Xray imaging and MRI at the beginning of this year which demonstrated spinal stenosis. Patient began physical therapy in Jan of this year until May. Patient reports she stopped PT in May because she did not achieve pain relief. Patient has recieved 3 injections so far, and reports no pain relief with these. Patient reports she  has started gabapentin 8/26 and has seen little pain relief from this either. Patient reports she has an appointment with surgeon 12/22/17 to discuss surgical interventions for stenosis at this point. Patient reports worst pain in the past week 7/10 with prolonged standing/walking; best 1/10 and reports heat improves LBP. Patient ambulates with single point cane since Dec/Jan (since back pain began). Pt denies N/V, B&B changes, unexplained weight fluctuation, saddle paresthesia, fever, night sweats, or unrelenting night pain at this time.    Diagnostic tests  MRI Jan: Advanced disc degeneration and spurring L1-2. Prominent osteophyte formation causing moderate to severe spinal stenosis. Possible superimposed disc protrusion on the left. Mild anterolisthesis L3-5    Patient Stated Goals  Patient would like to help husband with cooking (be able to stand long enough), return to vacuuming, gardening    Currently in Pain?  Yes    Pain Score  4     Pain Location  Back    Pain Orientation  Lower    Pain Descriptors / Indicators  Aching;Tightness    Pain Type  Chronic pain    Pain Onset  More than a month ago           TREATMENT  Ther-ex  NuStep L1 x 5 minutes for warm-up during history; Bridge  2 x 10 with 3s hold at top and cuing initially for glut activation and to avoid lumbar extension; Hooklying clamshell 2 x 10 red tband with cuing initially for eccentric control with good carry over following Posterior pelvic tilt 3s hold 2 x 10 with TC and moderate VC needed for initial understanding of core contraction with good carry over following; Supine TA marching 2 x 10 bilateral with min cuing to maintain core contraction throughout; Review of hamstring and glute stretch;  Manual Therapy  L hip long axis distraction with belt assisit, grade III, 30s/bout x 3 bouts; L hip medial to lateral mobilizations with belt assist grade III, 30s/bout x 3 bouts; L hip inferior mobilizations at 90 hip flexion  with belt assist, grade III, 30s/bout x 3 bouts;   ESTIM Moist heat packwith HiVolt ESTIM15 min at patient toleratedintensity of 260V at bilateral lumbar paraspinals. Attempted to decrease paraspinal tension (L>R).     Pt educated throughout session about proper posture and technique with exercises. Improved exercise technique, movement at target joints, use of target muscles after min to mod verbal, visual, tactile cues.    Pt demonstrates good motivation during session. She requires verbal and tactile cues for proper form with exercises. She reports pain relief and improve mobility in her back at the end of session. Pt encouraged to continue HEP and follow-up as scheduled. Will continue to progress with focus on core strength and LE strength and flexibility.        PT Short Term Goals - 11/23/17 1649      PT SHORT TERM GOAL #1   Title  Pt will be independent with HEP in order to improve strength, flexibility, and balance in order to decrease fall risk and improve function at home    Time  4    Period  Weeks    Status  New        PT Long Term Goals - 11/23/17 1649      PT LONG TERM GOAL #1   Title  Pt will decrease 5TSTS by at least 3 seconds in order to demonstrate clinically significant improvement in LE strength    Baseline  11/23/17 13sec    Time  8    Period  Weeks    Status  New      PT LONG TERM GOAL #2   Title  Pt will increase 6MWT by at least 39m (146ft) in order to demonstrate clinically significant improvement in cardiopulmonary and muscular endurance and community ambulation    Baseline  11/23/17 1048ft w/ 5/10 pain following    Time  8    Period  Weeks    Status  New      PT LONG TERM GOAL #3   Title  Pt will decrease worst pain as reported on NPRS by at least 3 points in order to demonstrate clinically significant reduction in pain.    Baseline  11/23/17 7/10    Time  8    Period  Weeks    Status  New      PT LONG TERM GOAL #4   Title  Patient will  increase FOTO score to 52 to demonstrate predicted increase in functional mobility to complete ADLs    Baseline  9//10/19 41    Time  8    Period  Weeks    Status  New            Plan - 12/07/17 7782    Clinical Impression Statement  Pt demonstrates good motivation during session. She requires verbal and tactile cues for proper form with exercises. She reports pain relief and improve mobility in her back at the end of session. Pt encouraged to continue HEP and follow-up as scheduled. Will continue to progress with focus on core strength and LE strength and flexibility.     Rehab Potential  Good    Clinical Impairments Affecting Rehab Potential  (-) chroncity of sx, failed conservative PT, age, sedentary lifestyle (+) motivation, social support    PT Frequency  2x / week    PT Duration  8 weeks    PT Treatment/Interventions  ADLs/Self Care Home Management;Electrical Stimulation;Therapeutic activities;Patient/family education;Taping;Therapeutic exercise;Iontophoresis 4mg /ml Dexamethasone;Aquatic Therapy;Gait training;Balance training;Passive range of motion;Neuromuscular re-education;Stair training;Moist Heat;Traction;Ultrasound;Cryotherapy;Manual techniques;Dry needling    PT Next Visit Plan  Manual techniques for LE, paraspinal tension; core and hip strengthening    PT Home Exercise Plan  Seated glute stretch, seated hamstring stretch, bridge    Consulted and Agree with Plan of Care  Patient       Patient will benefit from skilled therapeutic intervention in order to improve the following deficits and impairments:  Pain, Decreased strength  Visit Diagnosis: Chronic left-sided low back pain with left-sided sciatica     Problem List Patient Active Problem List   Diagnosis Date Noted  . Left-sided low back pain with left-sided sciatica 04/21/2017  . Arthritis of knee, right 01/27/2013  . HYPERLIPIDEMIA 06/16/2007  . ANXIETY 06/16/2007  . HYPERTENSION 06/16/2007  . HEMORRHOIDS  06/16/2007  . DIVERTICULOSIS, COLON 06/16/2007  . ARTHRITIS 06/16/2007  . CEREBROVASCULAR ACCIDENT, HX OF 06/16/2007    Lyndel Safe Huprich PT, DPT, GCS  Huprich,Jason 12/07/2017, 9:24 AM  [place] [facility] [address], [place], [address] Phone: [phone]   Fax:  [phone]  Name: Jansen Goodpasture Davilla MRN: [id number] Date of Birth: Jul 19, 1940

## 2017-12-09 ENCOUNTER — Encounter: Payer: Self-pay | Admitting: Physical Therapy

## 2017-12-09 ENCOUNTER — Ambulatory Visit: Payer: Medicare Other | Admitting: Physical Therapy

## 2017-12-09 DIAGNOSIS — M5442 Lumbago with sciatica, left side: Secondary | ICD-10-CM | POA: Diagnosis not present

## 2017-12-09 DIAGNOSIS — G8929 Other chronic pain: Secondary | ICD-10-CM

## 2017-12-09 NOTE — Therapy (Signed)
[facility] [address] [place], [address] Phone: [phone]   Fax:  [phone]  Physical Therapy Treatment  Patient Details  Name: Lisa Porter MRN: [id number] Date of Birth: [birth year] Referring Provider (PT): Dr. Virgina Jock   Encounter Date: 12/09/2017  PT End of Session - 12/09/17 1446    Visit Number  6    Number of Visits  17    Date for PT Re-Evaluation  01/18/18    PT Start Time  0230    PT Stop Time  0315    PT Time Calculation (min)  45 min    Activity Tolerance  Patient tolerated treatment well    Behavior During Therapy  [facility] for tasks assessed/performed       Past Medical History:  Diagnosis Date  . Arthritis   . GERD (gastroesophageal reflux disease)   . Hyperlipemia   . Hypertension   . Plantar fasciitis of right foot   . Stroke [facility]) 2000   no defecits/  TIA  2012    Past Surgical History:  Procedure Laterality Date  . BREAST SURGERY Right    biopsy x 3  . CHOLECYSTECTOMY    . EYE SURGERY Left    cataract extraction with IOL  . 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  Subjective Assessment - 12/09/17 1439    Subjective  Patient reports she is having some posterior L hip today 4/10, that she reports feels like a muscle cramp. Patient reports she had pain relief from ESTIM last session. Patient reports she enjoys aquatic therapy. Patient reports compliance with her HEP with no questions or concerns.     Pertinent History  Patient is a 77 year old female presenting with left sided LBP last Nov (2018). Patient had Xray imaging and MRI at the beginning of this year which demonstrated spinal stenosis. Patient began physical therapy in Jan of this year until May. Patient reports she stopped PT in  May because she did not achieve pain relief. Patient has recieved 3 injections so far, and reports no pain relief with these. Patient reports she has started gabapentin 8/26 and has seen little pain relief from this either. Patient reports she has an appointment with surgeon 12/22/17 to discuss surgical interventions for stenosis at this point. Patient reports worst pain in the past week 7/10 with prolonged standing/walking; best 1/10 and reports heat improves LBP. Patient ambulates with single point cane since Dec/Jan (since back pain began). Pt denies N/V, B&B changes, unexplained weight fluctuation, saddle paresthesia, fever, night sweats, or unrelenting night pain at this time.    Limitations  House hold activities;Lifting;Standing;Walking    How long can you sit comfortably?  1 hour    How long can you stand comfortably?  5 mins    How long can you walk comfortably?  60min    Diagnostic tests  MRI Jan: Advanced disc degeneration and spurring L1-2. Prominent osteophyte formation causing moderate to severe spinal stenosis. Possible superimposed disc protrusion on the left. Mild anterolisthesis L3-5    Patient Stated Goals  Patient would like to help husband with cooking (be able to stand long enough), return to vacuuming, gardening    Pain Onset  More than a month ago  Ther-ex  - NuStep L1 x 5 minutes for warm-up during history; - Bridge 3 x 10 with red tband at knees for increased resistance with min cuing for max available ROM - Hooklying clamshell 3 x 10 red tband with good carry over from last session - Supine TA marching 3x 10 bilateral with min cuing to maintain core contraction throughout;  Manual Therapy  STM with trigger point release to L glute max/min/deep ER   ESTIM Moist heat packwith HiVolt ESTIM65min at patient toleratedintensity of 250V inc to 255V at bilateral lumbar paraspinals. Attemptedto decrease paraspinal tension (L>R).  PT utilized time to educate patient  on self massage with tennis ball for tension relief at home. PT also advised patient to utilize heat for muscle tension relief as well.                             PT Education - 12/09/17 1442    Education Details  Therex form    Person(s) Educated  Patient    Methods  Explanation;Demonstration;Verbal cues    Comprehension  Verbalized understanding;Returned demonstration;Verbal cues required       PT Short Term Goals - 11/23/17 1649      PT SHORT TERM GOAL #1   Title  Pt will be independent with HEP in order to improve strength, flexibility, and balance in order to decrease fall risk and improve function at home    Time  4    Period  Weeks    Status  New        PT Long Term Goals - 11/23/17 1649      PT LONG TERM GOAL #1   Title  Pt will decrease 5TSTS by at least 3 seconds in order to demonstrate clinically significant improvement in LE strength    Baseline  11/23/17 13sec    Time  8    Period  Weeks    Status  New      PT LONG TERM GOAL #2   Title  Pt will increase 6MWT by at least 77m (156ft) in order to demonstrate clinically significant improvement in cardiopulmonary and muscular endurance and community ambulation    Baseline  11/23/17 1053ft w/ 5/10 pain following    Time  8    Period  Weeks    Status  New      PT LONG TERM GOAL #3   Title  Pt will decrease worst pain as reported on NPRS by at least 3 points in order to demonstrate clinically significant reduction in pain.    Baseline  11/23/17 7/10    Time  8    Period  Weeks    Status  New      PT LONG TERM GOAL #4   Title  Patient will increase FOTO score to 52 to demonstrate predicted increase in functional mobility to complete ADLs    Baseline  9//10/19 41    Time  8    Period  Weeks    Status  New            Plan - 12/09/17 1512    Clinical Impression Statement  Pt is demonstrating good carry over of exercise form between sessions, allowing for increased resistance and  reps. Patient with noted tension relief following manual and ESTIM with patient reporting decreased pain. PT educated patient on continuing HEP with self massage and heat to maintain tissue lengthening; pt verbalized understanding of all provided  education.     Rehab Potential  Good    Clinical Impairments Affecting Rehab Potential  (-) chroncity of sx, failed conservative PT, age, sedentary lifestyle (+) motivation, social support    PT Frequency  2x / week    PT Duration  8 weeks    PT Treatment/Interventions  ADLs/Self Care Home Management;Electrical Stimulation;Therapeutic activities;Patient/family education;Taping;Therapeutic exercise;Iontophoresis 4mg /ml Dexamethasone;Aquatic Therapy;Gait training;Balance training;Passive range of motion;Neuromuscular re-education;Stair training;Moist Heat;Traction;Ultrasound;Cryotherapy;Manual techniques;Dry needling    PT Next Visit Plan  Manual techniques for LE, paraspinal tension; core and hip strengthening    PT Home Exercise Plan  Seated glute stretch, seated hamstring stretch, bridge    Consulted and Agree with Plan of Care  Patient       Patient will benefit from skilled therapeutic intervention in order to improve the following deficits and impairments:  Pain, Decreased strength  Visit Diagnosis: Chronic left-sided low back pain with left-sided sciatica     Problem List Patient Active Problem List   Diagnosis Date Noted  . Left-sided low back pain with left-sided sciatica 04/21/2017  . Arthritis of knee, right 01/27/2013  . HYPERLIPIDEMIA 06/16/2007  . ANXIETY 06/16/2007  . HYPERTENSION 06/16/2007  . HEMORRHOIDS 06/16/2007  . DIVERTICULOSIS, COLON 06/16/2007  . ARTHRITIS 06/16/2007  . CEREBROVASCULAR ACCIDENT, HX OF 06/16/2007   Shelton Silvas PT, DPT Shelton Silvas 12/09/2017, 3:14 PM  [facility] [address] [place], [address] Phone: [phone]   Fax:   [phone]  Name: Lisa Porter MRN: [id number] Date of Birth: [birth year]

## 2017-12-14 ENCOUNTER — Ambulatory Visit: Payer: Medicare Other | Attending: Internal Medicine | Admitting: Physical Therapy

## 2017-12-14 ENCOUNTER — Encounter: Payer: Self-pay | Admitting: Physical Therapy

## 2017-12-14 DIAGNOSIS — G8929 Other chronic pain: Secondary | ICD-10-CM | POA: Diagnosis present

## 2017-12-14 DIAGNOSIS — M5442 Lumbago with sciatica, left side: Secondary | ICD-10-CM | POA: Diagnosis present

## 2017-12-14 NOTE — Therapy (Signed)
[facility] [address] [place], [address] Phone: [phone]   Fax:  [phone]  Physical Therapy Treatment  Patient Details  Name: Lisa Porter MRN: [id number] Date of Birth: 04/15/1940 Referring Provider (PT): Dr. Virgina Jock   Encounter Date: 12/14/2017  PT End of Session - 12/14/17 1040    Visit Number  7    Number of Visits  17    Date for PT Re-Evaluation  01/18/18    PT Start Time  1030    PT Stop Time  1115    PT Time Calculation (min)  45 min    Activity Tolerance  Patient tolerated treatment well    Behavior During Therapy  [facility] for tasks assessed/performed       Past Medical History:  Diagnosis Date  . Arthritis   . GERD (gastroesophageal reflux disease)   . Hyperlipemia   . Hypertension   . Plantar fasciitis of right foot   . Stroke [facility]) 2000   no defecits/  TIA  2012    Past Surgical History:  Procedure Laterality Date  . BREAST SURGERY Right    biopsy x 3  . CHOLECYSTECTOMY    . EYE SURGERY Left    cataract extraction with IOL  . 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  Subjective Assessment - 12/14/17 1035    Subjective  Patient walks into clinic and reports that she is very pleased with her decrease in pain and is "very thankful" for PT. Patient reports minimal pain unless she is standing for prolonged time. Patient reports compliance with her HEP with no questions or concerns. Patient continues to report good pain relief with manual and ESTIM techniques, and reports she is attempting to carry this over at home with HEP compliance and tennis ball massage.     Pertinent History  Patient is a 77 year old female presenting with left sided LBP last Nov (2018). Patient had Xray imaging and MRI at  the beginning of this year which demonstrated spinal stenosis. Patient began physical therapy in Jan of this year until May. Patient reports she stopped PT in May because she did not achieve pain relief. Patient has recieved 3 injections so far, and reports no pain relief with these. Patient reports she has started gabapentin 8/26 and has seen little pain relief from this either. Patient reports she has an appointment with surgeon 12/22/17 to discuss surgical interventions for stenosis at this point. Patient reports worst pain in the past week 7/10 with prolonged standing/walking; best 1/10 and reports heat improves LBP. Patient ambulates with single point cane since Dec/Jan (since back pain began). Pt denies N/V, B&B changes, unexplained weight fluctuation, saddle paresthesia, fever, night sweats, or unrelenting night pain at this time.    Limitations  House hold activities;Lifting;Standing;Walking    How long can you sit comfortably?  1 hour    How long can you stand comfortably?  5 mins    How long can you walk comfortably?  109min    Diagnostic tests  MRI Jan: Advanced disc degeneration and spurring L1-2. Prominent osteophyte formation causing moderate to severe spinal stenosis. Possible superimposed disc protrusion on the left. Mild anterolisthesis L3-5    Patient Stated Goals  Patient would like to help husband with  cooking (be able to stand long enough), return to vacuuming, gardening    Pain Onset  More than a month ago       Ther-ex - NuStep L2 x 5 minutes for increased LE demand - Bridge3x 10 with red tband at knees for increased resistance with min cuing for max available ROM - Supine TA marching3x 10 bilateralwith min cuing to maintain core contraction throughout - Mini squat from elevated 2x 10 with demo and max VC and TC for proper squat with hip motion that patient is able to correct 75% by the end of reps  Manual Therapy STM with trigger point release to L glute max/min/deep  ER- patient demonstrating some less tension than previous session with good relief through manual techniques.  ESTIM Moist heat packwithHiVolt ESTIM48min at patient toleratedintensity of265V inc to 245V at bilateral lumbar paraspinals. Attemptedto decrease paraspinal tension (L>R).PT utilized time to educate patient on self massage with tennis ball for tension relief at home. PT encouraged patient to continue to utilize glute contraction when completing STS transfer at home for carry over                        PT Education - 12/14/17 1039    Education Details  Therex    Person(s) Educated  Patient    Methods  Explanation;Verbal cues    Comprehension  Verbalized understanding;Verbal cues required       PT Short Term Goals - 11/23/17 1649      PT SHORT TERM GOAL #1   Title  Pt will be independent with HEP in order to improve strength, flexibility, and balance in order to decrease fall risk and improve function at home    Time  4    Period  Weeks    Status  New        PT Long Term Goals - 11/23/17 1649      PT LONG TERM GOAL #1   Title  Pt will decrease 5TSTS by at least 3 seconds in order to demonstrate clinically significant improvement in LE strength    Baseline  11/23/17 13sec    Time  8    Period  Weeks    Status  New      PT LONG TERM GOAL #2   Title  Pt will increase 6MWT by at least 5m (165ft) in order to demonstrate clinically significant improvement in cardiopulmonary and muscular endurance and community ambulation    Baseline  11/23/17 1035ft w/ 5/10 pain following    Time  8    Period  Weeks    Status  New      PT LONG TERM GOAL #3   Title  Pt will decrease worst pain as reported on NPRS by at least 3 points in order to demonstrate clinically significant reduction in pain.    Baseline  11/23/17 7/10    Time  8    Period  Weeks    Status  New      PT LONG TERM GOAL #4   Title  Patient will increase FOTO score to 52 to  demonstrate predicted increase in functional mobility to complete ADLs    Baseline  9//10/19 41    Time  8    Period  Weeks    Status  New            Plan - 12/14/17 1111    Clinical Impression Statement  PT continued therex progression with carry  over into functional movement. Patient requires max cuing for proper form with squat to activate core and glutes, without LB compensation, but is able to demonstrate some carry over by the end of sets. PT encouraged patient to continue to attempt to transfer this way at home for carry over. Patient continues to report tension relief following manual + modality treatment.     Rehab Potential  Good    Clinical Impairments Affecting Rehab Potential  (-) chroncity of sx, failed conservative PT, age, sedentary lifestyle (+) motivation, social support    PT Frequency  2x / week    PT Duration  8 weeks    PT Treatment/Interventions  ADLs/Self Care Home Management;Electrical Stimulation;Therapeutic activities;Patient/family education;Taping;Therapeutic exercise;Iontophoresis 4mg /ml Dexamethasone;Aquatic Therapy;Gait training;Balance training;Passive range of motion;Neuromuscular re-education;Stair training;Moist Heat;Traction;Ultrasound;Cryotherapy;Manual techniques;Dry needling    PT Next Visit Plan  Manual techniques for LE, paraspinal tension; core and hip strengthening    PT Home Exercise Plan  Seated glute stretch, seated hamstring stretch, bridge    Consulted and Agree with Plan of Care  Patient       Patient will benefit from skilled therapeutic intervention in order to improve the following deficits and impairments:  Pain, Decreased strength  Visit Diagnosis: Chronic left-sided low back pain with left-sided sciatica     Problem List Patient Active Problem List   Diagnosis Date Noted  . Left-sided low back pain with left-sided sciatica 04/21/2017  . Arthritis of knee, right 01/27/2013  . HYPERLIPIDEMIA 06/16/2007  . ANXIETY 06/16/2007   . HYPERTENSION 06/16/2007  . HEMORRHOIDS 06/16/2007  . DIVERTICULOSIS, COLON 06/16/2007  . ARTHRITIS 06/16/2007  . CEREBROVASCULAR ACCIDENT, HX OF 06/16/2007   Shelton Silvas PT, DPT Shelton Silvas 12/14/2017, 11:15 AM  [facility] [address], [place], [address] Phone: [phone]   Fax:  [phone]  Name: Lisa Porter MRN: [id number] Date of Birth: 1940/09/18

## 2017-12-16 ENCOUNTER — Ambulatory Visit: Payer: Medicare Other

## 2017-12-21 ENCOUNTER — Encounter: Payer: Self-pay | Admitting: Physical Therapy

## 2017-12-21 ENCOUNTER — Ambulatory Visit: Payer: Medicare Other

## 2017-12-21 ENCOUNTER — Ambulatory Visit: Payer: Medicare Other | Admitting: Physical Therapy

## 2017-12-21 ENCOUNTER — Encounter: Payer: Medicare Other | Admitting: Physical Therapy

## 2017-12-21 DIAGNOSIS — G8929 Other chronic pain: Secondary | ICD-10-CM

## 2017-12-21 DIAGNOSIS — M5442 Lumbago with sciatica, left side: Principal | ICD-10-CM

## 2017-12-21 NOTE — Therapy (Signed)
[facility] [address], [place], [address] Phone: [phone]   Fax:  [phone]  Physical Therapy Treatment  Patient Details  Name: Lisa Porter MRN: [id number] Date of Birth: October 20, 1940 Referring Provider (PT): Dr. Virgina Jock   Encounter Date: 12/21/2017  PT End of Session - 12/21/17 1526    Visit Number  8    Number of Visits  17    Date for PT Re-Evaluation  01/18/18    PT Start Time  0315    PT Stop Time  0400    PT Time Calculation (min)  45 min    Activity Tolerance  Patient tolerated treatment well    Behavior During Therapy  [facility] for tasks assessed/performed       Past Medical History:  Diagnosis Date  . Arthritis   . GERD (gastroesophageal reflux disease)   . Hyperlipemia   . Hypertension   . Plantar fasciitis of right foot   . Stroke [facility]) 2000   no defecits/  TIA  2012    Past Surgical History:  Procedure Laterality Date  . BREAST SURGERY Right    biopsy x 3  . CHOLECYSTECTOMY    . EYE SURGERY Left    cataract extraction with IOL  . 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  Subjective Assessment - 12/21/17 1520    Subjective  Patient reports she had some pain during her drive to [place] that subsided quickly. Patient reports she was able to walk around in [place] and hold her plate without using her cane which she was very pleased with. Patient reports she has been walking short distances (from car into PT clinic, around the house)without AD and is happy to report this has not increased her pain.     Pertinent History  Patient is a 77 year old female presenting with left sided LBP last Nov (2018). Patient had Xray imaging and MRI at the beginning of this year which demonstrated spinal  stenosis. Patient began physical therapy in Jan of this year until May. Patient reports she stopped PT in May because she did not achieve pain relief. Patient has recieved 3 injections so far, and reports no pain relief with these. Patient reports she has started gabapentin 8/26 and has seen little pain relief from this either. Patient reports she has an appointment with surgeon 12/22/17 to discuss surgical interventions for stenosis at this point. Patient reports worst pain in the past week 7/10 with prolonged standing/walking; best 1/10 and reports heat improves LBP. Patient ambulates with single point cane since Dec/Jan (since back pain began). Pt denies N/V, B&B changes, unexplained weight fluctuation, saddle paresthesia, fever, night sweats, or unrelenting night pain at this time.    Limitations  House hold activities;Lifting;Standing;Walking    How long can you sit comfortably?  1 hour    How long can you stand comfortably?  5 mins    How long can you walk comfortably?  88min    Diagnostic tests  MRI Jan: Advanced disc degeneration and spurring L1-2. Prominent osteophyte formation causing moderate to severe spinal stenosis. Possible superimposed disc protrusion on the left. Mild anterolisthesis L3-5    Patient Stated Goals  Patient would like to help husband with cooking (be able to stand long enough),  return to vacuuming, gardening    Pain Onset  More than a month ago       Ther-ex -NuStep L2 x3 min inc to L3 41min for increased LE demand - Mini squat with treadmill bar for UE balance support 3x 10 with good carry over from previous session with min cuing for proper form - Standing hip abd yellow tband with UE support for balance with min cuing for proper posture without lateral lean Following therex patient reports some tension in L glute that is uncomfortable.   Manual Therapy STM withtrigger point releaseto L glute max/min/deep ER with good relief through manual  techniques.  ESTIM Moist heat packwithHiVolt ESTIM40min at patient toleratedintensity of225Vinc to 235Vat bilateral lumbar paraspinals. Attemptedto decrease paraspinal tension (L>R).PT utilized time to demonstrate tennis ball massage as patient reports she felt some inc pressure when doing this at home                        PT Education - 12/21/17 1524    Education Details  Exercise form    Person(s) Educated  Patient    Methods  Explanation;Demonstration;Verbal cues    Comprehension  Verbalized understanding;Returned demonstration;Verbal cues required       PT Short Term Goals - 11/23/17 1649      PT SHORT TERM GOAL #1   Title  Pt will be independent with HEP in order to improve strength, flexibility, and balance in order to decrease fall risk and improve function at home    Time  4    Period  Weeks    Status  New        PT Long Term Goals - 11/23/17 1649      PT LONG TERM GOAL #1   Title  Pt will decrease 5TSTS by at least 3 seconds in order to demonstrate clinically significant improvement in LE strength    Baseline  11/23/17 13sec    Time  8    Period  Weeks    Status  New      PT LONG TERM GOAL #2   Title  Pt will increase 6MWT by at least 49m (189ft) in order to demonstrate clinically significant improvement in cardiopulmonary and muscular endurance and community ambulation    Baseline  11/23/17 1068ft w/ 5/10 pain following    Time  8    Period  Weeks    Status  New      PT LONG TERM GOAL #3   Title  Pt will decrease worst pain as reported on NPRS by at least 3 points in order to demonstrate clinically significant reduction in pain.    Baseline  11/23/17 7/10    Time  8    Period  Weeks    Status  New      PT LONG TERM GOAL #4   Title  Patient will increase FOTO score to 52 to demonstrate predicted increase in functional mobility to complete ADLs    Baseline  9//10/19 41    Time  8    Period  Weeks    Status  New             Plan - 12/21/17 1639    Clinical Impression Statement  Patient is continuing to report decreased pain and reliance on AD between sessions, allowing for inc therex progression. Patient is able to complete therex progression with accuracy following PT cuing, with some increase in "tension pain" that is resolved through  manual and modality techniques.     Rehab Potential  Good    Clinical Impairments Affecting Rehab Potential  (-) chroncity of sx, failed conservative PT, age, sedentary lifestyle (+) motivation, social support    PT Frequency  2x / week    PT Duration  8 weeks    PT Treatment/Interventions  ADLs/Self Care Home Management;Electrical Stimulation;Therapeutic activities;Patient/family education;Taping;Therapeutic exercise;Iontophoresis 4mg /ml Dexamethasone;Aquatic Therapy;Gait training;Balance training;Passive range of motion;Neuromuscular re-education;Stair training;Moist Heat;Traction;Ultrasound;Cryotherapy;Manual techniques;Dry needling    PT Next Visit Plan  Manual techniques for LE, paraspinal tension; core and hip strengthening    PT Home Exercise Plan  Seated glute stretch, seated hamstring stretch, bridge    Consulted and Agree with Plan of Care  Patient       Patient will benefit from skilled therapeutic intervention in order to improve the following deficits and impairments:  Pain, Decreased strength  Visit Diagnosis: Chronic left-sided low back pain with left-sided sciatica     Problem List Patient Active Problem List   Diagnosis Date Noted  . Left-sided low back pain with left-sided sciatica 04/21/2017  . Arthritis of knee, right 01/27/2013  . HYPERLIPIDEMIA 06/16/2007  . ANXIETY 06/16/2007  . HYPERTENSION 06/16/2007  . HEMORRHOIDS 06/16/2007  . DIVERTICULOSIS, COLON 06/16/2007  . ARTHRITIS 06/16/2007  . CEREBROVASCULAR ACCIDENT, HX OF 06/16/2007   Shelton Silvas PT, DPT Shelton Silvas 12/21/2017, 4:50 PM  [facility] [address], [place], [address] Phone: [phone]   Fax:  [phone]  Name: Lisa Porter MRN: [id number] Date of Birth: August 18, 1940

## 2017-12-23 ENCOUNTER — Other Ambulatory Visit: Payer: Self-pay

## 2017-12-23 ENCOUNTER — Ambulatory Visit: Payer: Medicare Other

## 2017-12-23 ENCOUNTER — Ambulatory Visit: Payer: Medicare Other | Admitting: Physical Therapy

## 2017-12-23 DIAGNOSIS — M5442 Lumbago with sciatica, left side: Secondary | ICD-10-CM | POA: Diagnosis not present

## 2017-12-23 DIAGNOSIS — G8929 Other chronic pain: Secondary | ICD-10-CM

## 2017-12-23 NOTE — Therapy (Signed)
[place] MAIN [facility] SERVICES [address] [place], [place], [address] Phone: [phone]   Fax:  [phone]  Physical Therapy Treatment  Patient Details  Name: Lisa Porter MRN: [id number] Date of Birth: 1940/03/27 Referring Provider (PT): Dr. Virgina Jock   Encounter Date: 12/23/2017  PT End of Session - 12/23/17 1534    Visit Number  9    Number of Visits  17    Date for PT Re-Evaluation  01/18/18    PT Start Time  0258    PT Stop Time  1510    PT Time Calculation (min)  55 min    Activity Tolerance  Patient tolerated treatment well    Behavior During Therapy  [facility] for tasks assessed/performed       Past Medical History:  Diagnosis Date  . Arthritis   . GERD (gastroesophageal reflux disease)   . Hyperlipemia   . Hypertension   . Plantar fasciitis of right foot   . Stroke [facility]) 2000   no defecits/  TIA  2012    Past Surgical History:  Procedure Laterality Date  . BREAST SURGERY Right    biopsy x 3  . CHOLECYSTECTOMY    . EYE SURGERY Left    cataract extraction with IOL  . 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  Subjective Assessment - 12/23/17 1530    Subjective  Pt reports L LB/buttock feeling much better after manual tissue work provided; pt notes only slight soreness at times. Pt has seen MD and will continue with a conservative approach toward back pain/stenosis. Pt pleased.     Pertinent History  Patient is a 77 year old female presenting with left sided LBP last Nov (2018). Patient had Xray imaging and MRI at the beginning of this year which demonstrated spinal stenosis. Patient began physical therapy in Jan of this year until May. Patient reports she stopped PT in May because she did not achieve pain relief. Patient has recieved 3  injections so far, and reports no pain relief with these. Patient reports she has started gabapentin 8/26 and has seen little pain relief from this either. Patient reports she has an appointment with surgeon 12/22/17 to discuss surgical interventions for stenosis at this point. Patient reports worst pain in the past week 7/10 with prolonged standing/walking; best 1/10 and reports heat improves LBP. Patient ambulates with single point cane since Dec/Jan (since back pain began). Pt denies N/V, B&B changes, unexplained weight fluctuation, saddle paresthesia, fever, night sweats, or unrelenting night pain at this time.      Enters/exits via ramp  Ambulation, blue dumbbells  Fwd 4 L  Side 2 L  Side step with minisquat 2L  Core with LE strength  Hip abd/add, 25x  Hip flex/ext, 25x  Resisted squat, supernoodle, 20x  Bench  Seated piriformis stretching   Cross body, B   Fig 4, B  Core with UE strengthening  Mitts in wall squat position, 25x ea   Sh abd/add   Sh flex/ext   Sh horiz abd/add  Fwd ambulation with thumb up arm drag, stretch/chest opener  Active stretching, 3x ea  ham/gastroc and hip flexor/quad  Position of release for piriformis.  PT Education - 12/23/17 1532    Education Details  Piriformis stretching, piriformis position of release, ambulation with arm drag for chest opener/stretch    Person(s) Educated  Patient    Methods  Explanation;Demonstration;Verbal cues;Tactile cues    Comprehension  Verbalized understanding;Returned demonstration;Verbal cues required       PT Short Term Goals - 11/23/17 1649      PT SHORT TERM GOAL #1   Title  Pt will be independent with HEP in order to improve strength, flexibility, and balance in order to decrease fall risk and improve function at home    Time  4    Period  Weeks    Status  New        PT Long Term Goals - 11/23/17 1649      PT LONG TERM GOAL #1   Title  Pt will  decrease 5TSTS by at least 3 seconds in order to demonstrate clinically significant improvement in LE strength    Baseline  11/23/17 13sec    Time  8    Period  Weeks    Status  New      PT LONG TERM GOAL #2   Title  Pt will increase 6MWT by at least 78m (119ft) in order to demonstrate clinically significant improvement in cardiopulmonary and muscular endurance and community ambulation    Baseline  11/23/17 1094ft w/ 5/10 pain following    Time  8    Period  Weeks    Status  New      PT LONG TERM GOAL #3   Title  Pt will decrease worst pain as reported on NPRS by at least 3 points in order to demonstrate clinically significant reduction in pain.    Baseline  11/23/17 7/10    Time  8    Period  Weeks    Status  New      PT LONG TERM GOAL #4   Title  Patient will increase FOTO score to 52 to demonstrate predicted increase in functional mobility to complete ADLs    Baseline  9//10/19 41    Time  8    Period  Weeks    Status  New            Plan - 12/23/17 1534    Clinical Impression Statement  Pt demonstrate good technique with resisted squats today. Mild increase in speed tolerated with LE exercises. Pt educated on piriformis stretching for cross body technique seated and supine and fig 4 in seated position. Pt will probably do best with fig 4 seated. Also instructed in position of release to use supine (bed, couch or recliner). Educated on use proactively (encouraged use 1-2x per day for at least 10 min per session) as well as reactively as needed.  SPC raised post session for improved upright posture to avoid sustained fwd flexion with ambulation. Continue to progress strength in core/back and LEs.     Rehab Potential  Good    Clinical Impairments Affecting Rehab Potential  (-) chroncity of sx, failed conservative PT, age, sedentary lifestyle (+) motivation, social support    PT Frequency  2x / week    PT Duration  8 weeks    PT Treatment/Interventions  ADLs/Self Care Home  Management;Electrical Stimulation;Therapeutic activities;Patient/family education;Taping;Therapeutic exercise;Iontophoresis 4mg /ml Dexamethasone;Aquatic Therapy;Gait training;Balance training;Passive range of motion;Neuromuscular re-education;Stair training;Moist Heat;Traction;Ultrasound;Cryotherapy;Manual techniques;Dry needling    PT Next Visit Plan  Manual techniques for LE, paraspinal tension; core and hip strengthening    PT Home  Exercise Plan  Seated glute stretch, seated hamstring stretch, bridge    Consulted and Agree with Plan of Care  Patient       Patient will benefit from skilled therapeutic intervention in order to improve the following deficits and impairments:  Pain, Decreased strength  Visit Diagnosis: Chronic left-sided low back pain with left-sided sciatica     Problem List Patient Active Problem List   Diagnosis Date Noted  . Left-sided low back pain with left-sided sciatica 04/21/2017  . Arthritis of knee, right 01/27/2013  . HYPERLIPIDEMIA 06/16/2007  . ANXIETY 06/16/2007  . HYPERTENSION 06/16/2007  . HEMORRHOIDS 06/16/2007  . DIVERTICULOSIS, COLON 06/16/2007  . ARTHRITIS 06/16/2007  . CEREBROVASCULAR ACCIDENT, HX OF 06/16/2007    [name] 12/23/2017, 3:40 PM  [place] [facility] MAIN [facility] SERVICES [address] [place], [place], [address] Phone: [phone]   Fax:  [phone]  Name: Lisa Porter MRN: [id number] Date of Birth: September 14, 1940

## 2017-12-28 ENCOUNTER — Encounter: Payer: Self-pay | Admitting: Physical Therapy

## 2017-12-28 ENCOUNTER — Ambulatory Visit: Payer: Medicare Other | Admitting: Physical Therapy

## 2017-12-28 DIAGNOSIS — G8929 Other chronic pain: Secondary | ICD-10-CM

## 2017-12-28 DIAGNOSIS — M5442 Lumbago with sciatica, left side: Secondary | ICD-10-CM | POA: Diagnosis not present

## 2017-12-28 NOTE — Therapy (Signed)
[place] [facility] [address] [place], [address] Phone: [phone]   Fax:  [phone]  Progress Note Physical Therapy Treatment  Dates of reporting period  11/23/17  to  12/28/17  Patient Details  Name: Lisa Porter Punches MRN: [id number] Date of Birth: [birth year] Referring Provider (PT): Dr. Virgina Jock   Encounter Date: 12/28/2017  PT End of Session - 12/28/17 1353    Visit Number  10    Number of Visits  17    Date for PT Re-Evaluation  01/18/18    PT Start Time  0145    PT Stop Time  0230    PT Time Calculation (min)  45 min    Activity Tolerance  Patient tolerated treatment well    Behavior During Therapy  [facility] for tasks assessed/performed       Past Medical History:  Diagnosis Date  . Arthritis   . GERD (gastroesophageal reflux disease)   . Hyperlipemia   . Hypertension   . Plantar fasciitis of right foot   . Stroke [facility]) 2000   no defecits/  TIA  2012    Past Surgical History:  Procedure Laterality Date  . BREAST SURGERY Right    biopsy x 3  . CHOLECYSTECTOMY    . EYE SURGERY Left    cataract extraction with IOL  . 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  Subjective Assessment - 12/28/17 1347    Subjective  Patient reports she walked through the "fall festival" Saturday with minimal pain, only some soreness. Patient reports she did have some pain this pain this morning following cleaning her shower and reports 2/10 pain in the L glute and L lumbar paraspinals that she would like "massaged" today. Patient reports compliance with HEP with no questions or concens.     Pertinent History  Patient is a 77 year old female presenting with left sided LBP last Nov (2018). Patient had Xray imaging and MRI at the beginning  of this year which demonstrated spinal stenosis. Patient began physical therapy in Jan of this year until May. Patient reports she stopped PT in May because she did not achieve pain relief. Patient has recieved 3 injections so far, and reports no pain relief with these. Patient reports she has started gabapentin 8/26 and has seen little pain relief from this either. Patient reports she has an appointment with surgeon 12/22/17 to discuss surgical interventions for stenosis at this point. Patient reports worst pain in the past week 7/10 with prolonged standing/walking; best 1/10 and reports heat improves LBP. Patient ambulates with single point cane since Dec/Jan (since back pain began). Pt denies N/V, B&B changes, unexplained weight fluctuation, saddle paresthesia, fever, night sweats, or unrelenting night pain at this time.    Limitations  House hold activities;Lifting;Standing;Walking    How long can you sit comfortably?  1 hour    How long can you stand comfortably?  5 mins    How long can you walk comfortably?  31min    Diagnostic tests  MRI Jan: Advanced disc degeneration and spurring L1-2. Prominent osteophyte formation causing moderate to severe spinal stenosis. Possible superimposed disc protrusion on the left. Mild anterolisthesis L3-5    Pain Onset  More than a month ago  Ther-ex -NuStep L3x29min for increased LE demand - Multiple trials of 5x STS without UE support with best time 12 sec with patient demonstrating good safety with eccentric control - 6MWT without AD where patient is demonstrating better posture and even, normal step length; 1154ft w/ 1/10 pain following - Trials of SLS with best time LLE 5sec RLE 8sec  - Demonstration of standing hip ext and abd, and mini squat at treadmill bar with band for HEP. With patient in supine with heat pack on L glute: reviewed strengthening parameters with printout to ensure understanding, with education on how to inc/dec resistance between  yellow and red tband as needed; pt verbalized understanding  Manual Therapy STM withtrigger point releaseto L glute max/min/deep ER with good relief through manual techniques. Following heat; manual L glute stretch 3x 30sec hold inc ROM each bout as able   Updated FOTO Lysle Morales)                       PT Education - 12/28/17 1351    Education Details  Exercise form    Person(s) Educated  Patient    Methods  Explanation;Demonstration;Verbal cues    Comprehension  Verbalized understanding;Returned demonstration;Verbal cues required       PT Short Term Goals - 12/28/17 1354      PT SHORT TERM GOAL #1   Title  Pt will be independent with HEP in order to improve strength, flexibility, and balance in order to decrease fall risk and improve function at home    Time  4    Period  Weeks    Status  On-going        PT Long Term Goals - 12/28/17 1354      PT LONG TERM GOAL #1   Title  Pt will decrease 5TSTS by at least 3 seconds in order to demonstrate clinically significant improvement in LE strength    Baseline  10/15: 12sec w/o UE support    Time  8    Period  Weeks    Status  On-going      PT LONG TERM GOAL #2   Title  Pt will increase 6MWT by at least 36m (142ft) in order to demonstrate clinically significant improvement in cardiopulmonary and muscular endurance and community ambulation    Baseline  12/28/17 1137ft w/ 1/10 pain following    Time  8    Period  Weeks    Status  New      PT LONG TERM GOAL #3   Title  Pt will decrease worst pain as reported on NPRS by at least 3 points in order to demonstrate clinically significant reduction in pain.    Baseline  12/28/17 2/10    Time  8    Period  Weeks    Status  Achieved      PT LONG TERM GOAL #4   Title  Patient will increase FOTO score to 52 to demonstrate predicted increase in functional mobility to complete ADLs    Baseline  12/28/17 51    Time  8    Period  Weeks    Status  On-going       PT LONG TERM GOAL #5   Title  Patient will demonstrate SLS 10sec bilat to demonstrate decreased fall risk    Baseline  12/28/17 L 5sec R 8sec    Time  8    Period  Weeks    Status  New  Plan - 12/28/17 1430    Clinical Impression Statement  PT reassessed patient, and patient is making good progress toward goals with remaining deficits in LE strength and SLS balance. PT discussed decreasing passive techniques/modalities and increasing standing activity tolerance. PT updated HEP to reflect this. Patient verbalized understanding of all provided education    Rehab Potential  Good    Clinical Impairments Affecting Rehab Potential  (-) chroncity of sx, failed conservative PT, age, sedentary lifestyle (+) motivation, social support    PT Frequency  2x / week    PT Duration  8 weeks    PT Treatment/Interventions  ADLs/Self Care Home Management;Electrical Stimulation;Therapeutic activities;Patient/family education;Taping;Therapeutic exercise;Iontophoresis 4mg /ml Dexamethasone;Aquatic Therapy;Gait training;Balance training;Passive range of motion;Neuromuscular re-education;Stair training;Moist Heat;Traction;Ultrasound;Cryotherapy;Manual techniques;Dry needling    PT Next Visit Plan  Manual techniques for LE, paraspinal tension; core and hip strengthening    PT Home Exercise Plan  Standing hip ext and abd with red tband at counter, mini squat at counter, 2-3x/week; seated glute stretch, seated hamstring stretch, bridge    Consulted and Agree with Plan of Care  Patient       Patient will benefit from skilled therapeutic intervention in order to improve the following deficits and impairments:  Pain, Decreased strength  Visit Diagnosis: Chronic left-sided low back pain with left-sided sciatica     Problem List Patient Active Problem List   Diagnosis Date Noted  . Left-sided low back pain with left-sided sciatica 04/21/2017  . Arthritis of knee, right 01/27/2013  . HYPERLIPIDEMIA  06/16/2007  . ANXIETY 06/16/2007  . HYPERTENSION 06/16/2007  . HEMORRHOIDS 06/16/2007  . DIVERTICULOSIS, COLON 06/16/2007  . ARTHRITIS 06/16/2007  . CEREBROVASCULAR ACCIDENT, HX OF 06/16/2007   Shelton Silvas PT, DPT Shelton Silvas 12/28/2017, 3:51 PM  [place] [facility] [address], [place], [address] Phone: [phone]   Fax:  [phone]  Name: Emmelina Mcloughlin Mato MRN: [id number] Date of Birth: [birth year]

## 2017-12-30 ENCOUNTER — Other Ambulatory Visit: Payer: Self-pay

## 2017-12-30 ENCOUNTER — Ambulatory Visit: Payer: Medicare Other

## 2017-12-30 DIAGNOSIS — G8929 Other chronic pain: Secondary | ICD-10-CM

## 2017-12-30 DIAGNOSIS — M5442 Lumbago with sciatica, left side: Principal | ICD-10-CM

## 2017-12-30 NOTE — Therapy (Signed)
[place] MAIN [facility] SERVICES [address] [place], [place], [address] Phone: [phone]   Fax:  [phone]  Physical Therapy Treatment  Patient Details  Name: Lisa Porter MRN: [id number] Date of Birth: 1940/03/31 Referring Provider (PT): Dr. Virgina Jock   Encounter Date: 12/30/2017  PT End of Session - 12/30/17 1513    Visit Number  11    Number of Visits  17    Date for PT Re-Evaluation  01/18/18    PT Start Time  1325    PT Stop Time  1415    PT Time Calculation (min)  50 min    Activity Tolerance  Patient tolerated treatment well    Behavior During Therapy  [facility] for tasks assessed/performed       Past Medical History:  Diagnosis Date  . Arthritis   . GERD (gastroesophageal reflux disease)   . Hyperlipemia   . Hypertension   . Plantar fasciitis of right foot   . Stroke [facility]) 2000   no defecits/  TIA  2012    Past Surgical History:  Procedure Laterality Date  . BREAST SURGERY Right    biopsy x 3  . CHOLECYSTECTOMY    . EYE SURGERY Left    cataract extraction with IOL  . 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  Subjective Assessment - 12/30/17 1509    Subjective  Pt reports B knee mildly sore, but notes she has been doing more activity, as her husband recently had carpal tunnel surgery. (pt now taking out trash, washing dishes and other household chores along with the regular activities/chores she does). Denies L hip pain, but does note some L groin soreness/tightness.     Pertinent History  Patient is a 77 year old female presenting with left sided LBP last Nov (2018). Patient had Xray imaging and MRI at the beginning of this year which demonstrated spinal stenosis. Patient began physical therapy in Jan of this year until May. Patient  reports she stopped PT in May because she did not achieve pain relief. Patient has recieved 3 injections so far, and reports no pain relief with these. Patient reports she has started gabapentin 8/26 and has seen little pain relief from this either. Patient reports she has an appointment with surgeon 12/22/17 to discuss surgical interventions for stenosis at this point. Patient reports worst pain in the past week 7/10 with prolonged standing/walking; best 1/10 and reports heat improves LBP. Patient ambulates with single point cane since Dec/Jan (since back pain began). Pt denies N/V, B&B changes, unexplained weight fluctuation, saddle paresthesia, fever, night sweats, or unrelenting night pain at this time.      Enters/exits via ramp  Ambulation  Fwd 4 L  Side 4 L  Side with minisquat 2 L  Core/LE strength  Squats, 25x  Sumo squats, 20x  Closed chain side lunges, B 20x ea   Suspended work (increased time for initial instruction)   Allayne Butcher  Ski    2 rounds of 30 " ea with 2 L recovery walk  Core with UE  Mitts, 20x ea   Sh abd/add   Sh flex/ext   Sh horiz abd/add  Active stretching, B 3 x 10 sec  Ham/gastroc and hip flexor/quad  Adductors  LB, middle R. L  PT Education - 12/30/17 1512    Education Details  closed chain side lunges, sumo squats, suspended work    [employer]) Educated  Patient    Methods  Explanation;Demonstration;Tactile cues;Verbal cues    Comprehension  Verbalized understanding;Returned demonstration;Tactile cues required       PT Short Term Goals - 12/28/17 1354      PT SHORT TERM GOAL #1   Title  Pt will be independent with HEP in order to improve strength, flexibility, and balance in order to decrease fall risk and improve function at home    Time  4    Period  Weeks    Status  On-going        PT Long Term Goals - 12/28/17 1354      PT LONG TERM GOAL #1   Title  Pt will decrease 5TSTS by at least 3  seconds in order to demonstrate clinically significant improvement in LE strength    Baseline  10/15: 12sec w/o UE support    Time  8    Period  Weeks    Status  On-going      PT LONG TERM GOAL #2   Title  Pt will increase 6MWT by at least 66m (144ft) in order to demonstrate clinically significant improvement in cardiopulmonary and muscular endurance and community ambulation    Baseline  12/28/17 1148ft w/ 1/10 pain following    Time  8    Period  Weeks    Status  New      PT LONG TERM GOAL #3   Title  Pt will decrease worst pain as reported on NPRS by at least 3 points in order to demonstrate clinically significant reduction in pain.    Baseline  12/28/17 2/10    Time  8    Period  Weeks    Status  Achieved      PT LONG TERM GOAL #4   Title  Patient will increase FOTO score to 52 to demonstrate predicted increase in functional mobility to complete ADLs    Baseline  12/28/17 51    Time  8    Period  Weeks    Status  On-going      PT LONG TERM GOAL #5   Title  Patient will demonstrate SLS 10sec bilat to demonstrate decreased fall risk    Baseline  12/28/17 L 5sec R 8sec    Time  8    Period  Weeks    Status  New            Plan - 12/30/17 1513    Clinical Impression Statement  Pt tolerated treatment progressions and new exercises well. Able to stretch through L groin pain with noteable relief. Pt performed suspended activity well and plan to progress activity as well as time endured if no complaints of increased symptoms over next 1-2 days. Improved technique noted with various squat activities today    Rehab Potential  Good    Clinical Impairments Affecting Rehab Potential  (-) chroncity of sx, failed conservative PT, age, sedentary lifestyle (+) motivation, social support    PT Frequency  2x / week    PT Duration  8 weeks    PT Treatment/Interventions  ADLs/Self Care Home Management;Electrical Stimulation;Therapeutic activities;Patient/family  education;Taping;Therapeutic exercise;Iontophoresis 4mg /ml Dexamethasone;Aquatic Therapy;Gait training;Balance training;Passive range of motion;Neuromuscular re-education;Stair training;Moist Heat;Traction;Ultrasound;Cryotherapy;Manual techniques;Dry needling    PT Next Visit Plan  Manual techniques for LE, paraspinal tension; core and hip strengthening    PT Home Exercise  Plan  Standing hip ext and abd with red tband at counter, mini squat at counter, 2-3x/week; seated glute stretch, seated hamstring stretch, bridge    Consulted and Agree with Plan of Care  Patient       Patient will benefit from skilled therapeutic intervention in order to improve the following deficits and impairments:  Pain, Decreased strength  Visit Diagnosis: Chronic left-sided low back pain with left-sided sciatica     Problem List Patient Active Problem List   Diagnosis Date Noted  . Left-sided low back pain with left-sided sciatica 04/21/2017  . Arthritis of knee, right 01/27/2013  . HYPERLIPIDEMIA 06/16/2007  . ANXIETY 06/16/2007  . HYPERTENSION 06/16/2007  . HEMORRHOIDS 06/16/2007  . DIVERTICULOSIS, COLON 06/16/2007  . ARTHRITIS 06/16/2007  . CEREBROVASCULAR ACCIDENT, HX OF 06/16/2007    [name] 12/30/2017, 3:18 PM  [place] MAIN [facility] SERVICES [address] [place], [place], [address] Phone: [phone]   Fax:  [phone]  Name: Affie Gasner Swamy MRN: [id number] Date of Birth: 21-Sep-1940

## 2018-01-04 ENCOUNTER — Encounter: Payer: Self-pay | Admitting: Physical Therapy

## 2018-01-04 ENCOUNTER — Ambulatory Visit: Payer: Medicare Other

## 2018-01-04 DIAGNOSIS — G8929 Other chronic pain: Secondary | ICD-10-CM

## 2018-01-04 DIAGNOSIS — M5442 Lumbago with sciatica, left side: Secondary | ICD-10-CM | POA: Diagnosis not present

## 2018-01-04 NOTE — Therapy (Signed)
[place] [facility] [address] [place], [address] Phone: [phone]   Fax:  [phone]  Physical Therapy Treatment  Patient Details  Name: Lisa Porter MRN: [id number] Date of Birth: 12-15-1940 Referring Provider (PT): Dr. Virgina Jock   Encounter Date: 01/04/2018  PT End of Session - 01/04/18 1346    Visit Number  12    Number of Visits  17    Date for PT Re-Evaluation  01/18/18    PT Start Time  1346    PT Stop Time  1432    PT Time Calculation (min)  46 min    Activity Tolerance  Patient tolerated treatment well    Behavior During Therapy  [facility] for tasks assessed/performed       Past Medical History:  Diagnosis Date  . Arthritis   . GERD (gastroesophageal reflux disease)   . Hyperlipemia   . Hypertension   . Plantar fasciitis of right foot   . Stroke [facility]) 2000   no defecits/  TIA  2012    Past Surgical History:  Procedure Laterality Date  . BREAST SURGERY Right    biopsy x 3  . CHOLECYSTECTOMY    . EYE SURGERY Left    cataract extraction with IOL  . 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  Subjective Assessment - 01/04/18 1349    Subjective  Patient reports that therapy is going well. States her L hip pain is greatly improved. Prior to therapy patient has been active with household chores.    Pertinent History  Patient is a 77 year old female presenting with left sided LBP last Nov (2018). Patient had Xray imaging and MRI at the beginning of this year which demonstrated spinal stenosis. Patient began physical therapy in Jan of this year until May. Patient reports she stopped PT in May because she did not achieve pain relief. Patient has recieved 3 injections so far, and reports no pain relief with these. Patient  reports she has started gabapentin 8/26 and has seen little pain relief from this either. Patient reports she has an appointment with surgeon 12/22/17 to discuss surgical interventions for stenosis at this point. Patient reports worst pain in the past week 7/10 with prolonged standing/walking; best 1/10 and reports heat improves LBP. Patient ambulates with single point cane since Dec/Jan (since back pain began). Pt denies N/V, B&B changes, unexplained weight fluctuation, saddle paresthesia, fever, night sweats, or unrelenting night pain at this time.    Currently in Pain?  Yes    Pain Score  1     Pain Location  Back    Pain Orientation  Lower    Pain Descriptors / Indicators  Aching      Ther-ex NuStep L4x58minfor increased LE demand SLS 10s holds x5 b/l Standing hip extension and abduction with YTB x10 LAQ b/l x20 Sit to stands from green chair + blue foam 2 x10 Seated Piriformis stretch 3x30s Seated HS stretch with foot up on stool 3x30s LTR 10x3s holds with tactile assist from PT  (maybe consider fall recovery mechanics prior to discharge)    PT Education - 01/04/18 1434    Education Details  therex form/technique    Person(s) Educated  Patient    Methods  Explanation;Demonstration;Tactile cues;Verbal cues  Comprehension  Verbalized understanding;Returned demonstration;Verbal cues required       PT Short Term Goals - 12/28/17 1354      PT SHORT TERM GOAL #1   Title  Pt will be independent with HEP in order to improve strength, flexibility, and balance in order to decrease fall risk and improve function at home    Time  4    Period  Weeks    Status  On-going        PT Long Term Goals - 12/28/17 1354      PT LONG TERM GOAL #1   Title  Pt will decrease 5TSTS by at least 3 seconds in order to demonstrate clinically significant improvement in LE strength    Baseline  10/15: 12sec w/o UE support    Time  8    Period  Weeks    Status  On-going      PT LONG TERM GOAL  #2   Title  Pt will increase 6MWT by at least 71m (142ft) in order to demonstrate clinically significant improvement in cardiopulmonary and muscular endurance and community ambulation    Baseline  12/28/17 1136ft w/ 1/10 pain following    Time  8    Period  Weeks    Status  New      PT LONG TERM GOAL #3   Title  Pt will decrease worst pain as reported on NPRS by at least 3 points in order to demonstrate clinically significant reduction in pain.    Baseline  12/28/17 2/10    Time  8    Period  Weeks    Status  Achieved      PT LONG TERM GOAL #4   Title  Patient will increase FOTO score to 52 to demonstrate predicted increase in functional mobility to complete ADLs    Baseline  12/28/17 51    Time  8    Period  Weeks    Status  On-going      PT LONG TERM GOAL #5   Title  Patient will demonstrate SLS 10sec bilat to demonstrate decreased fall risk    Baseline  12/28/17 L 5sec R 8sec    Time  8    Period  Weeks    Status  New            Plan - 01/04/18 1435    Clinical Impression Statement  Patient able to tolerate most standing exercises today with minimal to no complaints of LBP or L hip pain. Describes occasional sensation as "tightness". Most limiting factor is complaints of L knee pain (possible this is a radiating symptom).     PT Frequency  2x / week    PT Duration  8 weeks    PT Treatment/Interventions  ADLs/Self Care Home Management;Electrical Stimulation;Therapeutic activities;Patient/family education;Taping;Therapeutic exercise;Iontophoresis 4mg /ml Dexamethasone;Aquatic Therapy;Gait training;Balance training;Passive range of motion;Neuromuscular re-education;Stair training;Moist Heat;Traction;Ultrasound;Cryotherapy;Manual techniques;Dry needling    PT Next Visit Plan  Manual techniques for LE, paraspinal tension; core and hip strengthening    PT Home Exercise Plan  Standing hip ext and abd with red tband at counter, mini squat at counter, 2-3x/week; seated glute  stretch, seated hamstring stretch, bridge       Patient will benefit from skilled therapeutic intervention in order to improve the following deficits and impairments:  Pain, Decreased strength  Visit Diagnosis: Chronic left-sided low back pain with left-sided sciatica     Problem List Patient Active Problem List   Diagnosis Date Noted  .  Left-sided low back pain with left-sided sciatica 04/21/2017  . Arthritis of knee, right 01/27/2013  . HYPERLIPIDEMIA 06/16/2007  . ANXIETY 06/16/2007  . HYPERTENSION 06/16/2007  . HEMORRHOIDS 06/16/2007  . DIVERTICULOSIS, COLON 06/16/2007  . ARTHRITIS 06/16/2007  . CEREBROVASCULAR ACCIDENT, HX OF 06/16/2007    Lieutenant Diego PT, DPT 2:41 PM,01/04/18 [place] [facility] [address], [place], [address] Phone: [phone]   Fax:  [phone]  Name: Kaelani Kendrick Stamper MRN: [id number] Date of Birth: 1940/11/30

## 2018-01-06 ENCOUNTER — Ambulatory Visit: Payer: Medicare Other

## 2018-01-06 ENCOUNTER — Other Ambulatory Visit: Payer: Self-pay

## 2018-01-06 DIAGNOSIS — M5442 Lumbago with sciatica, left side: Principal | ICD-10-CM

## 2018-01-06 DIAGNOSIS — G8929 Other chronic pain: Secondary | ICD-10-CM

## 2018-01-06 NOTE — Therapy (Signed)
[place] MAIN [facility] [address] [place], [place], [address] Phone: [phone]   Fax:  [phone]  Physical Therapy Treatment  Patient Details  Name: Lisa Porter MRN: [id number] Date of Birth: Dec 05, 1940 Referring Provider (PT): Dr. Virgina Jock   Encounter Date: 01/06/2018  PT End of Session - 01/06/18 1526    Visit Number  13    Number of Visits  17    Date for PT Re-Evaluation  01/18/18    PT Start Time  5053    PT Stop Time  1515    PT Time Calculation (min)  60 min    Activity Tolerance  Patient tolerated treatment well    Behavior During Therapy  [name]       Past Medical History:  Diagnosis Date  . Arthritis   . GERD (gastroesophageal reflux disease)   . Hyperlipemia   . Hypertension   . Plantar fasciitis of right foot   . Stroke [facility]) 2000   no defecits/  TIA  2012    Past Surgical History:  Procedure Laterality Date  . BREAST SURGERY Right    biopsy x 3  . CHOLECYSTECTOMY    . EYE SURGERY Left    cataract extraction with IOL  . 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  Subjective Assessment - 01/06/18 1523    Subjective  Pt reports mild increased soreness in L hip post last aquatic session for 1.5 days; unsure, but feels may be due to side lunge exerise that pt felt was challenging. Current L hip pain is 1-2/10.    Pertinent History  Patient is a 77 year old female presenting with left sided LBP last Nov (2018). Patient had Xray imaging and MRI at the beginning of this year which demonstrated spinal stenosis. Patient began physical therapy in Jan of this year until May. Patient reports she stopped PT in May because she did not achieve pain relief. Patient has recieved 3 injections so far, and  reports no pain relief with these. Patient reports she has started gabapentin 8/26 and has seen little pain relief from this either. Patient reports she has an appointment with surgeon 12/22/17 to discuss surgical interventions for stenosis at this point. Patient reports worst pain in the past week 7/10 with prolonged standing/walking; best 1/10 and reports heat improves LBP. Patient ambulates with single point cane since Dec/Jan (since back pain began). Pt denies N/V, B&B changes, unexplained weight fluctuation, saddle paresthesia, fever, night sweats, or unrelenting night pain at this time.      Enters/exits via ramp  Ambulation, warm up  4 L fwd  2 L side   2 L side with mini squat   LE strength  squats, 30x  Suspended work, 1 min ea  Jog  [name]  Ski 2 L recovery walk with arm drag  SLS with toe touch other and ball toss, B 30x each  Core with UE strength  Mitts, 30x ea   Sh abd/add   Sh flex/ext   Sh horiz abd/add  Suspended work as above followed by 2 L recovery walk  Core with LE strength, 30x ea  Hip abd/add  Hip flex/ext  Bench  Bike, 5 min  Active stretching 3 x 10 sec ea  Hams/gastroc and hip  flexor/quad  LB, middle/R/middle/L                           PT Education - 01/06/18 1526    Education Details  continued core stabilization and suspended work, SLS with toe touch and ball toss    Person(s) Educated  Patient    Methods  Explanation;Demonstration    Comprehension  Verbalized understanding;Returned demonstration;Verbal cues required       PT Short Term Goals - 12/28/17 1354      PT SHORT TERM GOAL #1   Title  Pt will be independent with HEP in order to improve strength, flexibility, and balance in order to decrease fall risk and improve function at home    Time  4    Period  Weeks    Status  On-going        PT Long Term Goals - 12/28/17 1354      PT LONG TERM GOAL #1   Title  Pt will decrease 5TSTS by at least 3 seconds  in order to demonstrate clinically significant improvement in LE strength    Baseline  10/15: 12sec w/o UE support    Time  8    Period  Weeks    Status  On-going      PT LONG TERM GOAL #2   Title  Pt will increase 6MWT by at least 10m (156ft) in order to demonstrate clinically significant improvement in cardiopulmonary and muscular endurance and community ambulation    Baseline  12/28/17 1130ft w/ 1/10 pain following    Time  8    Period  Weeks    Status  New      PT LONG TERM GOAL #3   Title  Pt will decrease worst pain as reported on NPRS by at least 3 points in order to demonstrate clinically significant reduction in pain.    Baseline  12/28/17 2/10    Time  8    Period  Weeks    Status  Achieved      PT LONG TERM GOAL #4   Title  Patient will increase FOTO score to 52 to demonstrate predicted increase in functional mobility to complete ADLs    Baseline  12/28/17 51    Time  8    Period  Weeks    Status  On-going      PT LONG TERM GOAL #5   Title  Patient will demonstrate SLS 10sec bilat to demonstrate decreased fall risk    Baseline  12/28/17 L 5sec R 8sec    Time  8    Period  Weeks    Status  New            Plan - 01/06/18 1527    Clinical Impression Statement  Pt tolerated session well. Able to increase suspended work time. No increased pain during session. Mild R knee stiffness associated with closed chain RLE with LLE work; resolved with knee flexion/ext and encouraged "soft knee" position in closed chain, which resolved any further sfiffness complaints.     PT Frequency  2x / week    PT Duration  8 weeks    PT Treatment/Interventions  ADLs/Self Care Home Management;Electrical Stimulation;Therapeutic activities;Patient/family education;Taping;Therapeutic exercise;Iontophoresis 4mg /ml Dexamethasone;Aquatic Therapy;Gait training;Balance training;Passive range of motion;Neuromuscular re-education;Stair training;Moist Heat;Traction;Ultrasound;Cryotherapy;Manual  techniques;Dry needling    PT Next Visit Plan  Manual techniques for LE, paraspinal tension; core and hip strengthening    PT Home Exercise Plan  Standing hip ext and abd with red tband at counter, mini squat at counter, 2-3x/week; seated glute stretch, seated hamstring stretch, bridge       Patient will benefit from skilled therapeutic intervention in order to improve the following deficits and impairments:  Pain, Decreased strength  Visit Diagnosis: Chronic left-sided low back pain with left-sided sciatica     Problem List Patient Active Problem List   Diagnosis Date Noted  . Left-sided low back pain with left-sided sciatica 04/21/2017  . Arthritis of knee, right 01/27/2013  . HYPERLIPIDEMIA 06/16/2007  . ANXIETY 06/16/2007  . HYPERTENSION 06/16/2007  . HEMORRHOIDS 06/16/2007  . DIVERTICULOSIS, COLON 06/16/2007  . ARTHRITIS 06/16/2007  . CEREBROVASCULAR ACCIDENT, HX OF 06/16/2007    [name] 01/06/2018, 3:30 PM  [place] MAIN [facility] SERVICES [address] [place], [place], [address] Phone: [phone]   Fax:  [phone]  Name: Peg Fifer Diaz MRN: [id number] Date of Birth: [birth year]

## 2018-01-11 ENCOUNTER — Encounter: Payer: Self-pay | Admitting: Physical Therapy

## 2018-01-11 ENCOUNTER — Ambulatory Visit: Payer: Medicare Other

## 2018-01-11 DIAGNOSIS — M5442 Lumbago with sciatica, left side: Principal | ICD-10-CM

## 2018-01-11 DIAGNOSIS — G8929 Other chronic pain: Secondary | ICD-10-CM

## 2018-01-11 NOTE — Therapy (Signed)
[place] [facility] [address], [place], [address] Phone: [phone]   Fax:  [phone]  Physical Therapy Treatment  Patient Details  Name: Lisa Porter MRN: [id number] Date of Birth: 1941-02-12 Referring Provider (PT): Dr. Virgina Jock   Encounter Date: 01/11/2018  PT End of Session - 01/11/18 1404    Visit Number  14    Number of Visits  17    Date for PT Re-Evaluation  01/18/18    PT Start Time  1302    PT Stop Time  1344    PT Time Calculation (min)  42 min    Activity Tolerance  Patient tolerated treatment well    Behavior During Therapy  [facility] for tasks assessed/performed       Past Medical History:  Diagnosis Date  . Arthritis   . GERD (gastroesophageal reflux disease)   . Hyperlipemia   . Hypertension   . Plantar fasciitis of right foot   . Stroke [facility]) 2000   no defecits/  TIA  2012    Past Surgical History:  Procedure Laterality Date  . BREAST SURGERY Right    biopsy x 3  . CHOLECYSTECTOMY    . EYE SURGERY Left    cataract extraction with IOL  . 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  Subjective Assessment - 01/11/18 1302    Subjective  Pt reports mild increased soreness in L hip post last aquatic session for 1.5 days; unsure, but feels may be due to side lunge exerise that pt felt was challenging. Current L hip pain is 1-2/10.    Pertinent History  Patient is a 77 year old female presenting with left sided LBP last Nov (2018). Patient had Xray imaging and MRI at the beginning of this year which demonstrated spinal stenosis. Patient began physical therapy in Jan of this year until May. Patient reports she stopped PT in May because she did not achieve pain relief. Patient has recieved 3 injections so far,  and reports no pain relief with these. Patient reports she has started gabapentin 8/26 and has seen little pain relief from this either. Patient reports she has an appointment with surgeon 12/22/17 to discuss surgical interventions for stenosis at this point. Patient reports worst pain in the past week 7/10 with prolonged standing/walking; best 1/10 and reports heat improves LBP. Patient ambulates with single point cane since Dec/Jan (since back pain began). Pt denies N/V, B&B changes, unexplained weight fluctuation, saddle paresthesia, fever, night sweats, or unrelenting night pain at this time.    Currently in Pain?  Yes    Pain Score  2     Pain Location  Back    Pain Orientation  Lower    Pain Descriptors / Indicators  Aching    Pain Type  Chronic pain    Pain Onset  More than a month ago    Pain Frequency  Intermittent       Ther-ex NuStep L4x54minfor increased LE demand SLS 10s holds x5 b/l Standing hip extension and abduction with 2.5# AW 2x10 LAQ with 2.5# AW b/l x20 Seated marches with 2.5# AW Mini squats to table 2x10 Seated Piriformis stretch 3x30s Seated HS stretch with foot up on stool 3x30s prone lying x72mins (no centralization of in symptoms)  Stair navigation 4 steps x2 rounds with use of one UE, no deviations noted or necessary to correct, able to perform safely  Patient reported improvement in L knee pain with extension based exercises performed 2 times during session when patient complained of increased L knee pain  Manual therapy: STM to L posterior hip musculature, explained STM with tennis ball in supine  (consider fall recovery mechanics prior to discharge)    PT Education - 01/11/18 1303    Education Details  therapeutic exercise/technique    Person(s) Educated  Patient    Methods  Explanation;Demonstration    Comprehension  Verbalized understanding;Returned demonstration;Verbal cues required       PT Short Term Goals - 12/28/17 1354      PT SHORT  TERM GOAL #1   Title  Pt will be independent with HEP in order to improve strength, flexibility, and balance in order to decrease fall risk and improve function at home    Time  4    Period  Weeks    Status  On-going        PT Long Term Goals - 12/28/17 1354      PT LONG TERM GOAL #1   Title  Pt will decrease 5TSTS by at least 3 seconds in order to demonstrate clinically significant improvement in LE strength    Baseline  10/15: 12sec w/o UE support    Time  8    Period  Weeks    Status  On-going      PT LONG TERM GOAL #2   Title  Pt will increase 6MWT by at least 8m (15ft) in order to demonstrate clinically significant improvement in cardiopulmonary and muscular endurance and community ambulation    Baseline  12/28/17 1131ft w/ 1/10 pain following    Time  8    Period  Weeks    Status  New      PT LONG TERM GOAL #3   Title  Pt will decrease worst pain as reported on NPRS by at least 3 points in order to demonstrate clinically significant reduction in pain.    Baseline  12/28/17 2/10    Time  8    Period  Weeks    Status  Achieved      PT LONG TERM GOAL #4   Title  Patient will increase FOTO score to 52 to demonstrate predicted increase in functional mobility to complete ADLs    Baseline  12/28/17 51    Time  8    Period  Weeks    Status  On-going      PT LONG TERM GOAL #5   Title  Patient will demonstrate SLS 10sec bilat to demonstrate decreased fall risk    Baseline  12/28/17 L 5sec R 8sec    Time  8    Period  Weeks    Status  New            Plan - 01/11/18 1402    Clinical Impression Statement  Patient able to complete strengthening program without complaints of L hip/back pain, however does mention L knee pain. Patient instructed and performed standing lumbar extension exercises twice during session when knee symptoms incresaed with patient reported improvement in L knee symptoms. Instructed to perform at home when knee pain increases and update PT next  session about how it goes. Patient would also like to review fall recovery techniques prior to discharge.     Rehab Potential  Good    PT  Frequency  2x / week    PT Duration  8 weeks    PT Treatment/Interventions  ADLs/Self Care Home Management;Electrical Stimulation;Therapeutic activities;Patient/family education;Taping;Therapeutic exercise;Iontophoresis 4mg /ml Dexamethasone;Aquatic Therapy;Gait training;Balance training;Passive range of motion;Neuromuscular re-education;Stair training;Moist Heat;Traction;Ultrasound;Cryotherapy;Manual techniques;Dry needling    PT Next Visit Plan  Manual techniques for LE, paraspinal tension; core and hip strengthening    PT Home Exercise Plan  Standing hip ext and abd with red tband at counter, mini squat at counter, 2-3x/week; seated glute stretch, seated hamstring stretch, bridge       Patient will benefit from skilled therapeutic intervention in order to improve the following deficits and impairments:  Pain, Decreased strength  Visit Diagnosis: Chronic left-sided low back pain with left-sided sciatica     Problem List Patient Active Problem List   Diagnosis Date Noted  . Left-sided low back pain with left-sided sciatica 04/21/2017  . Arthritis of knee, right 01/27/2013  . HYPERLIPIDEMIA 06/16/2007  . ANXIETY 06/16/2007  . HYPERTENSION 06/16/2007  . HEMORRHOIDS 06/16/2007  . DIVERTICULOSIS, COLON 06/16/2007  . ARTHRITIS 06/16/2007  . CEREBROVASCULAR ACCIDENT, HX OF 06/16/2007    Lieutenant Diego PT, DPT 2:07 PM,01/11/18 [place] [facility] [address], [place], [address] Phone: [phone]   Fax:  [phone]  Name: Lisa Porter MRN: [id number] Date of Birth: [birth year]

## 2018-01-13 ENCOUNTER — Other Ambulatory Visit: Payer: Self-pay

## 2018-01-13 ENCOUNTER — Ambulatory Visit: Payer: Medicare Other

## 2018-01-13 DIAGNOSIS — G8929 Other chronic pain: Secondary | ICD-10-CM

## 2018-01-13 DIAGNOSIS — M5442 Lumbago with sciatica, left side: Secondary | ICD-10-CM | POA: Diagnosis not present

## 2018-01-13 NOTE — Therapy (Signed)
[place] MAIN [facility] SERVICES [address] [place], [place], [address] Phone: [phone]   Fax:  [phone]  Physical Therapy Treatment  Patient Details  Name: Lisa Porter MRN: [id number] Date of Birth: 08/17/1940 Referring Provider (PT): Dr. Virgina Jock   Encounter Date: 01/13/2018  PT End of Session - 01/13/18 1339    Visit Number  15    Number of Visits  17    Date for PT Re-Evaluation  01/18/18    PT Start Time  1100    PT Stop Time  1205    PT Time Calculation (min)  65 min    Activity Tolerance  Patient tolerated treatment well    Behavior During Therapy  [facility] for tasks assessed/performed       Past Medical History:  Diagnosis Date  . Arthritis   . GERD (gastroesophageal reflux disease)   . Hyperlipemia   . Hypertension   . Plantar fasciitis of right foot   . Stroke [facility]) 2000   no defecits/  TIA  2012    Past Surgical History:  Procedure Laterality Date  . BREAST SURGERY Right    biopsy x 3  . CHOLECYSTECTOMY    . EYE SURGERY Left    cataract extraction with IOL  . 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  Subjective Assessment - 01/13/18 1342    Subjective  Pt reports spouse has hurt his knee (twisted) and on crutches, so pt has had to be more active taking care of him. Despite increased activity, pt L hip/buttock pain has maintained at a 1-2/10 pain level. Of side note, pt's spouse had recent (2 wks ago) carpal tunnel and trigger finger release surgery; advised pt to speak with pt's MD regarding using crutches post carpal tunnel surgery. Explained to pt that bearing weight through the surgical hand/wrist is contraindicated. Pt will check on spouses behalf.       Enters/exits via ramp  Ambulation  Fwd 4 L   Side 2 L   Side with  squat 2 L  Core/LE strength  Rail   Hip abd/add, B  30x   Hip flex/ext, B 30x   Squat, 20x   Sumo squat 20x  Core with UE work  Wall sit (sh's in water)   Mitts, 20x ea    Sh abd/add    Sh flex/ext    Sh horiz abd/add  Resisted walking  Mitts   Fwd 4 L with thumb up arm drag   Bkwd 4 L with breast stroke arms   Side step 4 L with thumb up elbow flexed to 90 deg in sagittal plane  Suspended work 2 rounds of 3 exercises 1 min long each with 2 L recovery walk post  Jog  jack   Ski  Active stretching, 3x ea B  Stand hamstrings/gastroc  Quad/hip flexor  Seated fig 4 piriformis  Re education for home use piriformis position of release                        PT Education - 01/13/18 1341    Education Details  Reviewed position of release for piriformis muscle for home use for improved technique and result.        PT Short Term Goals - 12/28/17  Franquez #1   Title  Pt will be independent with HEP in order to improve strength, flexibility, and balance in order to decrease fall risk and improve function at home    Time  4    Period  Weeks    Status  On-going        PT Long Term Goals - 12/28/17 1354      PT LONG TERM GOAL #1   Title  Pt will decrease 5TSTS by at least 3 seconds in order to demonstrate clinically significant improvement in LE strength    Baseline  10/15: 12sec w/o UE support    Time  8    Period  Weeks    Status  On-going      PT LONG TERM GOAL #2   Title  Pt will increase 6MWT by at least 81m (16ft) in order to demonstrate clinically significant improvement in cardiopulmonary and muscular endurance and community ambulation    Baseline  12/28/17 1156ft w/ 1/10 pain following    Time  8    Period  Weeks    Status  New      PT LONG TERM GOAL #3   Title  Pt will decrease worst pain as reported on NPRS by at least 3 points in order to demonstrate clinically significant reduction in pain.    Baseline   12/28/17 2/10    Time  8    Period  Weeks    Status  Achieved      PT LONG TERM GOAL #4   Title  Patient will increase FOTO score to 52 to demonstrate predicted increase in functional mobility to complete ADLs    Baseline  12/28/17 51    Time  8    Period  Weeks    Status  On-going      PT LONG TERM GOAL #5   Title  Patient will demonstrate SLS 10sec bilat to demonstrate decreased fall risk    Baseline  12/28/17 L 5sec R 8sec    Time  8    Period  Weeks    Status  New            Plan - 01/13/18 1339    Clinical Impression Statement  Pt tolerated session well with increased suspended time work. Progressing resistive walking with cues required for step lengts and body/LE posture. No increased pain during session. Continue to progress.     Rehab Potential  Good    PT Frequency  2x / week    PT Duration  8 weeks    PT Treatment/Interventions  ADLs/Self Care Home Management;Electrical Stimulation;Therapeutic activities;Patient/family education;Taping;Therapeutic exercise;Iontophoresis 4mg /ml Dexamethasone;Aquatic Therapy;Gait training;Balance training;Passive range of motion;Neuromuscular re-education;Stair training;Moist Heat;Traction;Ultrasound;Cryotherapy;Manual techniques;Dry needling    PT Next Visit Plan  Manual techniques for LE, paraspinal tension; core and hip strengthening    PT Home Exercise Plan  Standing hip ext and abd with red tband at counter, mini squat at counter, 2-3x/week; seated glute stretch, seated hamstring stretch, bridge       Patient will benefit from skilled therapeutic intervention in order to improve the following deficits and impairments:  Pain, Decreased strength  Visit Diagnosis: Chronic left-sided low back pain with left-sided sciatica     Problem List Patient Active Problem List   Diagnosis Date Noted  . Left-sided low back pain with left-sided sciatica 04/21/2017  . Arthritis of knee, right 01/27/2013  . HYPERLIPIDEMIA 06/16/2007  .  ANXIETY  06/16/2007  . HYPERTENSION 06/16/2007  . HEMORRHOIDS 06/16/2007  . DIVERTICULOSIS, COLON 06/16/2007  . ARTHRITIS 06/16/2007  . CEREBROVASCULAR ACCIDENT, HX OF 06/16/2007    [name] 01/13/2018, 1:43 PM  [facility] MAIN [facility] SERVICES [address] [place], [place], [address] Phone: [phone]   Fax:  [phone]  Name: Fatina Sprankle Ladley MRN: [id number] Date of Birth: Jun 03, 1940

## 2018-01-18 ENCOUNTER — Encounter: Payer: Self-pay | Admitting: Physical Therapy

## 2018-01-18 ENCOUNTER — Ambulatory Visit: Payer: Medicare Other | Attending: Internal Medicine | Admitting: Physical Therapy

## 2018-01-18 DIAGNOSIS — G8929 Other chronic pain: Secondary | ICD-10-CM

## 2018-01-18 DIAGNOSIS — M5442 Lumbago with sciatica, left side: Secondary | ICD-10-CM | POA: Diagnosis not present

## 2018-01-18 NOTE — Therapy (Signed)
[place] [facility] [address] [place], [address] Phone: [phone]   Fax:  [phone]  Physical Therapy Treatment  Patient Details  Name: Lisa Porter MRN: [id number] Date of Birth: 31-Jul-1940 Referring Provider (PT): Dr. Virgina Jock   Encounter Date: 01/18/2018  PT End of Session - 01/18/18 1309    Visit Number  16    Number of Visits  17    Date for PT Re-Evaluation  01/18/18    PT Start Time  0100    PT Stop Time  0145    PT Time Calculation (min)  45 min    Activity Tolerance  Patient tolerated treatment well    Behavior During Therapy  [facility] for tasks assessed/performed       Past Medical History:  Diagnosis Date  . Arthritis   . GERD (gastroesophageal reflux disease)   . Hyperlipemia   . Hypertension   . Plantar fasciitis of right foot   . Stroke [facility]) 2000   no defecits/  TIA  2012    Past Surgical History:  Procedure Laterality Date  . BREAST SURGERY Right    biopsy x 3  . CHOLECYSTECTOMY    . EYE SURGERY Left    cataract extraction with IOL  . 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  Subjective Assessment - 01/18/18 1306    Subjective  Patient reports minimal LBP over the past week. Patient reports following prolonged walking she has some soreness, that subsides quickly. Patient reports compliance with her HEP with no questions or concerns. Patient reports she would like to be seen in PT for her balance, as she had one "bad fall" a couple years ago, and feels "unsteady at times" and is worried about falling.     Pertinent History  Patient is a 77 year old female presenting with left sided LBP last Nov (2018). Patient had Xray imaging and MRI at the beginning of this year which demonstrated spinal stenosis.  Patient began physical therapy in Jan of this year until May. Patient reports she stopped PT in May because she did not achieve pain relief. Patient has recieved 3 injections so far, and reports no pain relief with these. Patient reports she has started gabapentin 8/26 and has seen little pain relief from this either. Patient reports she has an appointment with surgeon 12/22/17 to discuss surgical interventions for stenosis at this point. Patient reports worst pain in the past week 7/10 with prolonged standing/walking; best 1/10 and reports heat improves LBP. Patient ambulates with single point cane since Dec/Jan (since back pain began). Pt denies N/V, B&B changes, unexplained weight fluctuation, saddle paresthesia, fever, night sweats, or unrelenting night pain at this time.    Limitations  House hold activities;Lifting;Standing;Walking    How long can you sit comfortably?  1 hour    How long can you stand comfortably?  5 mins    How long can you walk comfortably?  21mn    Diagnostic tests  MRI Jan: Advanced disc degeneration and spurring L1-2. Prominent osteophyte formation causing moderate to severe spinal stenosis. Possible superimposed disc protrusion on the left. Mild anterolisthesis L3-5    Patient Stated Goals  Patient would like to help husband with cooking (be able to stand long enough), return to vacuuming,  gardening    Pain Onset  More than a month ago          Ther-Ex - Nustep 40mn L2 for warmup during POC discussion on possible d/c this session with evaluation of balance/fall risk prevention following - 6MWT where patient is able to ambulate over 12223fwith 1/10 pain following - Trials of 5xSTS with best time 8sec w/o UE support following min cuing for full hip ext - SLS trial on bilat LE 10sec each with increased sway on LLE, with good noted ankle strategy - Led patient [place] questions on activity rigor  - HEP review; visual review of strengthening exercises with  education on parameters (reps/sets/inc and dec intensity as needed/frequency) and physical demo of stretches to ensure accuracy with education on parameters - Discussed possible evaluation following the holidays for patient concern on "unsteadiness", with education in PT role on dynamic balance training and fall prevention                   PT Education - 01/18/18 1308    Education Details  DC recommendations, HEP review, possible balance evaluation    Person(s) Educated  Patient    Methods  Explanation;Demonstration;Handout;Verbal cues    Comprehension  Verbalized understanding;Returned demonstration;Verbal cues required       PT Short Term Goals - 01/18/18 1310      PT SHORT TERM GOAL #1   Title  Pt will be independent with HEP in order to improve strength, flexibility, and balance in order to decrease fall risk and improve function at home    Time  4    Period  Weeks    Status  Achieved        PT Long Term Goals - 01/18/18 1310      PT LONG TERM GOAL #1   Title  Pt will decrease 5TSTS by at least 3 seconds in order to demonstrate clinically significant improvement in LE strength    Baseline  01/17/18: 8sec w/o UE support    Time  8    Period  Weeks    Status  Achieved      PT LONG TERM GOAL #2   Title  Pt will increase 6MWT by at least [id number] order to demonstrate clinically significant improvement in cardiopulmonary and muscular endurance and community ambulation    Baseline  01/18/18 [id number]/10 pain following    Time  8    Period  Weeks    Status  Achieved      PT LONG TERM GOAL #3   Title  Pt will decrease worst pain as reported on NPRS by at least 3 points in order to demonstrate clinically significant reduction in pain.    Baseline  01/18/18 2/10 subsides quickly    Time  8    Period  Weeks    Status  Achieved      PT LONG TERM GOAL #4   Title  Patient will increase FOTO score to 52 to demonstrate predicted increase in functional mobility  to complete ADLs    Baseline  01/17/18 65    Time  8    Period  Weeks    Status  Achieved      PT LONG TERM GOAL #5   Title  Patient will demonstrate SLS 10sec bilat to demonstrate decreased fall risk    Baseline  01/18/18 10sec    Time  8    Period  Weeks    Status  Achieved            Plan - 01/18/18 1342    Clinical Impression Statement  Pt has met all goals at this time to safely d/c PT for LBP and associated symptoms. PT reviewed maintainence HEP with patient to ensure understanding, which pt verbalized and demonstrated compliance with. Pt was given PT clinic contact info should further questions/concerns arise.     Rehab Potential  Good    Clinical Impairments Affecting Rehab Potential  (-) chroncity of sx, failed conservative PT, age, sedentary lifestyle (+) motivation, social support    PT Frequency  2x / week    PT Duration  8 weeks    PT Treatment/Interventions  ADLs/Self Care Home Management;Electrical Stimulation;Therapeutic activities;Patient/family education;Taping;Therapeutic exercise;Iontophoresis '4mg'$ /ml Dexamethasone;Aquatic Therapy;Gait training;Balance training;Passive range of motion;Neuromuscular re-education;Stair training;Moist Heat;Traction;Ultrasound;Cryotherapy;Manual techniques;Dry needling    PT Next Visit Plan  Manual techniques for LE, paraspinal tension; core and hip strengthening    PT Home Exercise Plan  Standing hip ext and abd with red tband at counter, mini squat at counter, 2-3x/week; seated glute stretch, seated hamstring stretch, bridge    Consulted and Agree with Plan of Care  Patient       Patient will benefit from skilled therapeutic intervention in order to improve the following deficits and impairments:  Pain, Decreased strength  Visit Diagnosis: Chronic left-sided low back pain with left-sided sciatica     Problem List Patient Active Problem List   Diagnosis Date Noted  . Left-sided low back pain with left-sided sciatica  04/21/2017  . Arthritis of knee, right 01/27/2013  . HYPERLIPIDEMIA 06/16/2007  . ANXIETY 06/16/2007  . HYPERTENSION 06/16/2007  . HEMORRHOIDS 06/16/2007  . DIVERTICULOSIS, COLON 06/16/2007  . ARTHRITIS 06/16/2007  . CEREBROVASCULAR ACCIDENT, HX OF 06/16/2007   Shelton Silvas PT, DPT Shelton Silvas 01/18/2018, 1:43 PM  [place] [facility] [address] [place], [address] Phone: [phone]   Fax:  [phone]  Name: Consuello Lassalle Jump MRN: [id number] Date of Birth: 02-03-1941

## 2018-01-20 ENCOUNTER — Ambulatory Visit: Payer: Medicare Other

## 2018-01-25 ENCOUNTER — Ambulatory Visit: Payer: Medicare Other | Admitting: Physical Therapy

## 2018-03-07 ENCOUNTER — Ambulatory Visit: Payer: Medicare Other | Admitting: Podiatry

## 2018-03-07 ENCOUNTER — Encounter: Payer: Self-pay | Admitting: Podiatry

## 2018-03-07 DIAGNOSIS — D689 Coagulation defect, unspecified: Secondary | ICD-10-CM

## 2018-03-07 DIAGNOSIS — M79674 Pain in right toe(s): Secondary | ICD-10-CM | POA: Diagnosis not present

## 2018-03-07 DIAGNOSIS — M79675 Pain in left toe(s): Secondary | ICD-10-CM

## 2018-03-07 DIAGNOSIS — B351 Tinea unguium: Secondary | ICD-10-CM

## 2018-03-07 NOTE — Progress Notes (Signed)
Complaint:  Visit Type: Patient returns to my office for continued preventative foot care services. Complaint: Patient states" my nails have grown long and thick and become painful to walk and wear shoes" Patient is taking plavix.  The patient presents for preventative foot care services. No changes to ROS.  Patient is taking plavix.  Podiatric Exam: Vascular: dorsalis pedis and posterior tibial pulses are palpable bilateral. Capillary return is immediate. Temperature gradient is WNL. Skin turgor WNL  Sensorium: Normal Semmes Weinstein monofilament test. Normal tactile sensation bilaterally. Nail Exam: Pt has thick disfigured discolored nails with subungual debris noted bilateral entire nail hallux through fifth toenails Ulcer Exam: There is no evidence of ulcer or pre-ulcerative changes or infection. Orthopedic Exam: Muscle tone and strength are WNL. No limitations in general ROM. No crepitus or effusions noted. Bone spur second toe right. Skin: No Porokeratosis. No infection or ulcers.  Mucoid cyst second toe right foot.  Diagnosis:  Onychomycosis, , Pain in right toe, pain in left toes  Treatment & Plan Procedures and Treatment: Consent by patient was obtained for treatment procedures.   Debridement of mycotic and hypertrophic toenails, 1 through 5 bilateral and clearing of subungual debris. No ulceration, no infection noted. Patient to be scheduled in [place]. Return Visit-Office Procedure: Patient instructed to return to the office for a follow up visit 3 months for continued evaluation and treatment.    Brooklinn Longbottom DPM 

## 2018-05-04 DIAGNOSIS — K30 Functional dyspepsia: Secondary | ICD-10-CM | POA: Insufficient documentation

## 2018-05-16 ENCOUNTER — Ambulatory Visit: Payer: Medicare Other | Admitting: Podiatry

## 2018-05-16 ENCOUNTER — Encounter: Payer: Self-pay | Admitting: Podiatry

## 2018-05-16 DIAGNOSIS — M79675 Pain in left toe(s): Secondary | ICD-10-CM

## 2018-05-16 DIAGNOSIS — D689 Coagulation defect, unspecified: Secondary | ICD-10-CM

## 2018-05-16 DIAGNOSIS — M79674 Pain in right toe(s): Secondary | ICD-10-CM | POA: Diagnosis not present

## 2018-05-16 DIAGNOSIS — B351 Tinea unguium: Secondary | ICD-10-CM | POA: Diagnosis not present

## 2018-05-16 NOTE — Progress Notes (Signed)
Complaint:  Visit Type: Patient returns to my office for continued preventative foot care services. Complaint: Patient states" my nails have grown long and thick and become painful to walk and wear shoes" Patient is taking plavix.  The patient presents for preventative foot care services. No changes to ROS.  Patient is taking plavix.  Podiatric Exam: Vascular: dorsalis pedis and posterior tibial pulses are palpable bilateral. Capillary return is immediate. Temperature gradient is WNL. Skin turgor WNL  Sensorium: Normal Semmes Weinstein monofilament test. Normal tactile sensation bilaterally. Nail Exam: Pt has thick disfigured discolored nails with subungual debris noted bilateral entire nail hallux through fifth toenails Ulcer Exam: There is no evidence of ulcer or pre-ulcerative changes or infection. Orthopedic Exam: Muscle tone and strength are WNL. No limitations in general ROM. No crepitus or effusions noted. Bone spur second toe right. Skin: No Porokeratosis. No infection or ulcers.  Mucoid cyst second toe right foot.  Diagnosis:  Onychomycosis, , Pain in right toe, pain in left toes  Treatment & Plan Procedures and Treatment: Consent by patient was obtained for treatment procedures.   Debridement of mycotic and hypertrophic toenails, 1 through 5 bilateral and clearing of subungual debris. No ulceration, no infection noted. Patient to be scheduled in [place]. Return Visit-Office Procedure: Patient instructed to return to the office for a follow up visit 3 months for continued evaluation and treatment.    Lionardo Haze DPM 

## 2018-07-25 ENCOUNTER — Ambulatory Visit: Payer: Medicare Other | Admitting: Podiatry

## 2018-08-04 ENCOUNTER — Encounter: Payer: Self-pay | Admitting: Podiatry

## 2018-08-04 ENCOUNTER — Other Ambulatory Visit: Payer: Self-pay

## 2018-08-04 ENCOUNTER — Ambulatory Visit: Payer: Medicare Other | Admitting: Podiatry

## 2018-08-04 VITALS — Temp 96.8°F

## 2018-08-04 DIAGNOSIS — M79675 Pain in left toe(s): Secondary | ICD-10-CM

## 2018-08-04 DIAGNOSIS — D689 Coagulation defect, unspecified: Secondary | ICD-10-CM

## 2018-08-04 DIAGNOSIS — B351 Tinea unguium: Secondary | ICD-10-CM | POA: Diagnosis not present

## 2018-08-04 DIAGNOSIS — M79674 Pain in right toe(s): Secondary | ICD-10-CM

## 2018-08-04 NOTE — Progress Notes (Signed)
Complaint:  Visit Type: Patient returns to my office for continued preventative foot care services. Complaint: Patient states" my nails have grown long and thick and become painful to walk and wear shoes" Patient is taking plavix.  The patient presents for preventative foot care services. No changes to ROS.  Patient is taking plavix.  Podiatric Exam: Vascular: dorsalis pedis and posterior tibial pulses are palpable bilateral. Capillary return is immediate. Temperature gradient is WNL. Skin turgor WNL  Sensorium: Normal Semmes Weinstein monofilament test. Normal tactile sensation bilaterally. Nail Exam: Pt has thick disfigured discolored nails with subungual debris noted bilateral entire nail hallux through fifth toenails Ulcer Exam: There is no evidence of ulcer or pre-ulcerative changes or infection. Orthopedic Exam: Muscle tone and strength are WNL. No limitations in general ROM. No crepitus or effusions noted. Bone spur second toe right. Skin: No Porokeratosis. No infection or ulcers.  Mucoid cyst second toe right foot.  Diagnosis:  Onychomycosis, , Pain in right toe, pain in left toes  Treatment & Plan Procedures and Treatment: Consent by patient was obtained for treatment procedures.   Debridement of mycotic and hypertrophic toenails, 1 through 5 bilateral and clearing of subungual debris. No ulceration, no infection noted. Patient to be scheduled in [place]. Return Visit-Office Procedure: Patient instructed to return to the office for a follow up visit 3 months for continued evaluation and treatment.    Gardiner Barefoot DPM

## 2018-10-13 ENCOUNTER — Other Ambulatory Visit: Payer: Self-pay

## 2018-10-13 ENCOUNTER — Encounter: Payer: Self-pay | Admitting: Podiatry

## 2018-10-13 ENCOUNTER — Ambulatory Visit: Payer: Medicare Other | Admitting: Podiatry

## 2018-10-13 VITALS — Temp 98.7°F

## 2018-10-13 DIAGNOSIS — B351 Tinea unguium: Secondary | ICD-10-CM | POA: Diagnosis not present

## 2018-10-13 DIAGNOSIS — D689 Coagulation defect, unspecified: Secondary | ICD-10-CM

## 2018-10-13 DIAGNOSIS — M79675 Pain in left toe(s): Secondary | ICD-10-CM | POA: Diagnosis not present

## 2018-10-13 DIAGNOSIS — M79674 Pain in right toe(s): Secondary | ICD-10-CM

## 2018-10-13 NOTE — Progress Notes (Signed)
Complaint:  Visit Type: Patient returns to my office for continued preventative foot care services. Complaint: Patient states" my nails have grown long and thick and become painful to walk and wear shoes" Patient is taking plavix.  The patient presents for preventative foot care services. No changes to ROS.  Patient is taking plavix.  Podiatric Exam: Vascular: dorsalis pedis and posterior tibial pulses are palpable bilateral. Capillary return is immediate. Temperature gradient is WNL. Skin turgor WNL  Sensorium: Normal Semmes Weinstein monofilament test. Normal tactile sensation bilaterally. Nail Exam: Pt has thick disfigured discolored nails with subungual debris noted bilateral entire nail hallux through fifth toenails Ulcer Exam: There is no evidence of ulcer or pre-ulcerative changes or infection. Orthopedic Exam: Muscle tone and strength are WNL. No limitations in general ROM. No crepitus or effusions noted. Bone spur second toe right. Skin: No Porokeratosis. No infection or ulcers.  Mucoid cyst second toe right foot.  Diagnosis:  Onychomycosis, , Pain in right toe, pain in left toes  Treatment & Plan Procedures and Treatment: Consent by patient was obtained for treatment procedures.   Debridement of mycotic and hypertrophic toenails, 1 through 5 bilateral and clearing of subungual debris. No ulceration, no infection noted. Patient to be scheduled in [place]. Return Visit-Office Procedure: Patient instructed to return to the office for a follow up visit 10 weeks  for continued evaluation and treatment.    Gardiner Barefoot DPM

## 2018-11-05 IMAGING — RF DG ESOPHAGUS
6 of 7 series · 15 of 21 positions shown · non-contrast
Comparison: None.

CLINICAL DATA: Dysphagia

EXAM:
ESOPHOGRAM / BARIUM SWALLOW / BARIUM TABLET STUDY
TECHNIQUE: Combined double contrast and single contrast examination performed
using effervescent crystals, thick barium liquid, and thin barium
liquid. The patient was observed with fluoroscopy swallowing a 13 mm
barium sulphate tablet.
FLUOROSCOPY TIME:  Fluoroscopy Time:  1 minutes 54 second
Radiation Exposure Index (if provided by the fluoroscopic device):
Number of Acquired Spot Images: 0

[Series 1: cp_standard · 0.39mm/px · 2 of 89 frames shown (1 of 6)]
[frame 14/89]
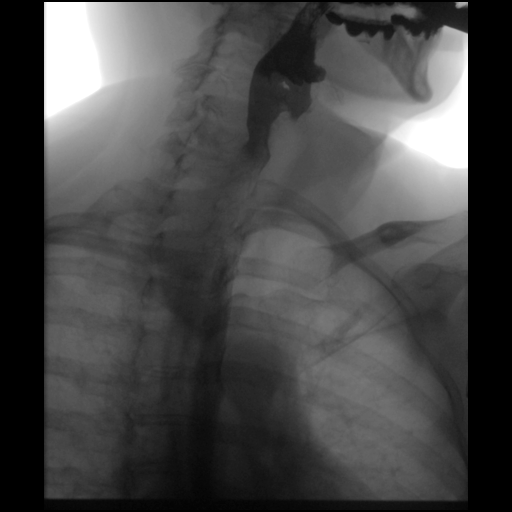
[frame 76/89]
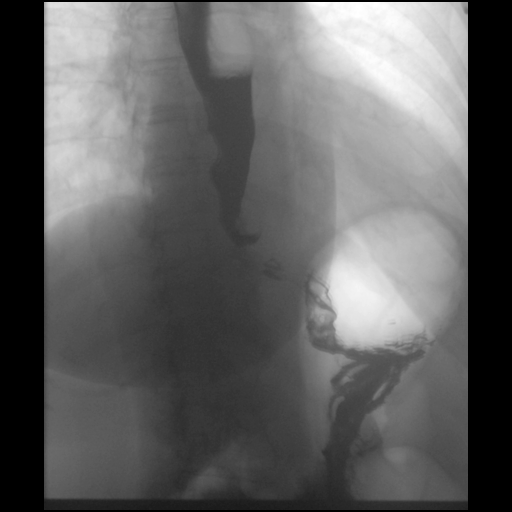

[Series 2: cp_standard · 0.39mm/px · 3 of 6 frames shown (2 of 6)]
[frame 1/6]
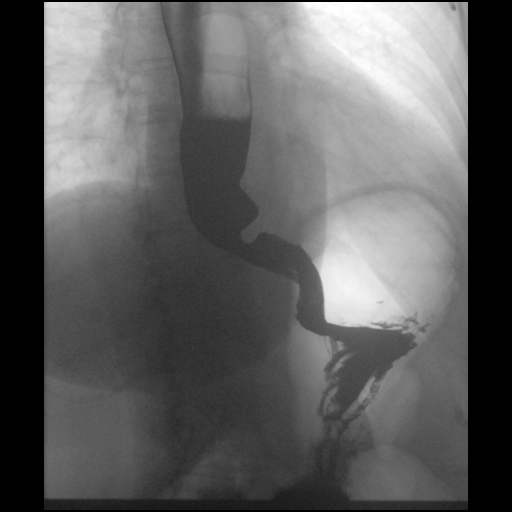
[frame 4/6]
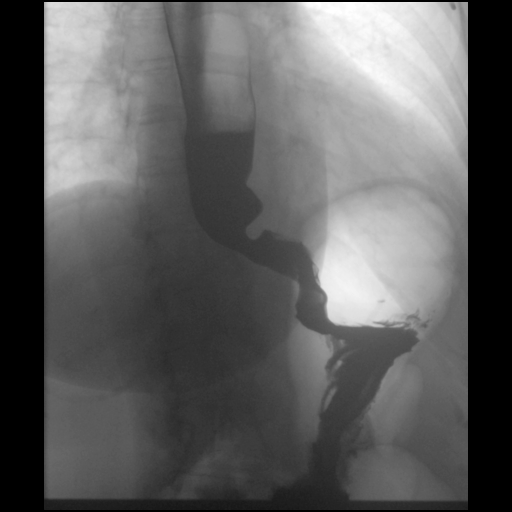
[frame 6/6]
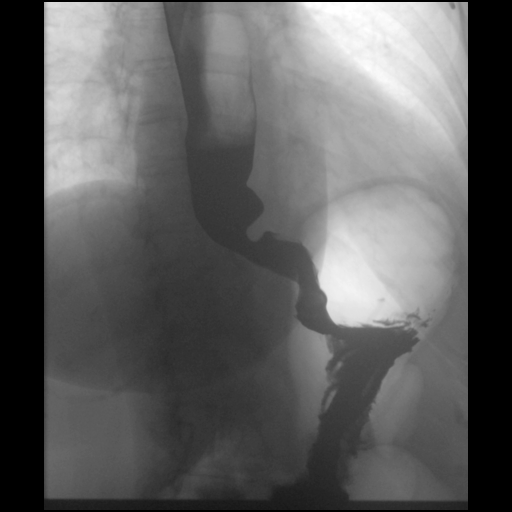

[Series 3: cp_standard · 0.39mm/px · 3 of 57 frames shown (3 of 6)]
[frame 9/57]
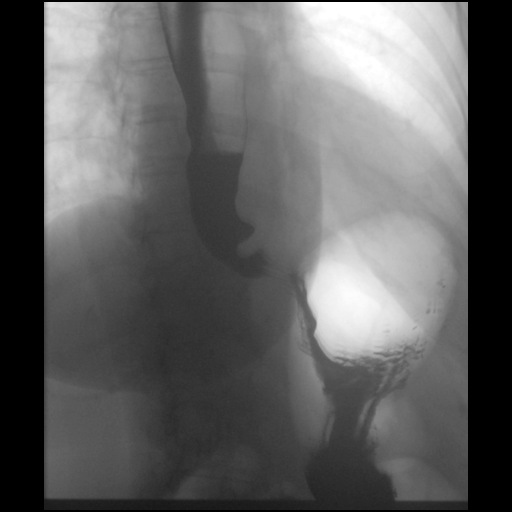
[frame 49/57]
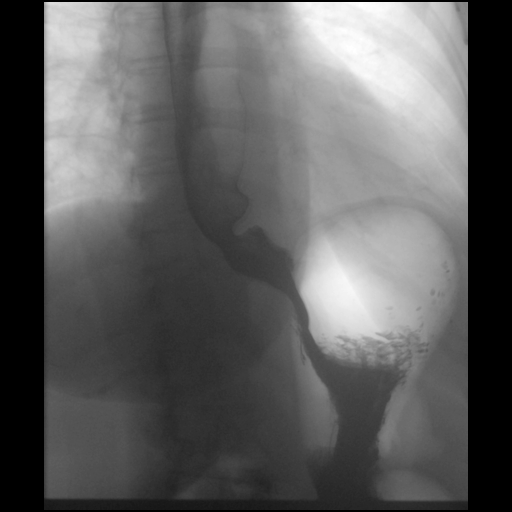
[frame 52/57]
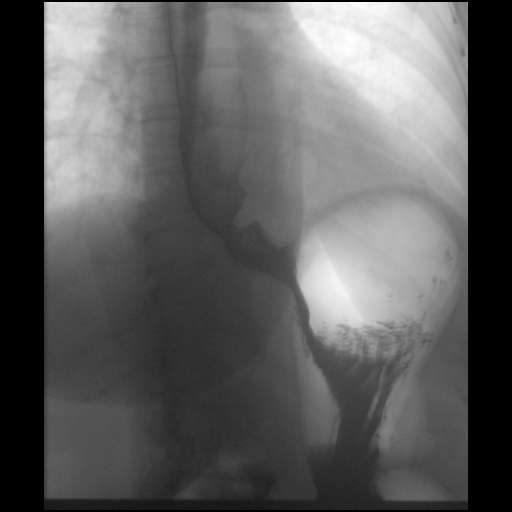

[Series 4: cp_standard · 0.18mm/px · 1 of 1 slices shown (4 of 6)]
[im 1/1]
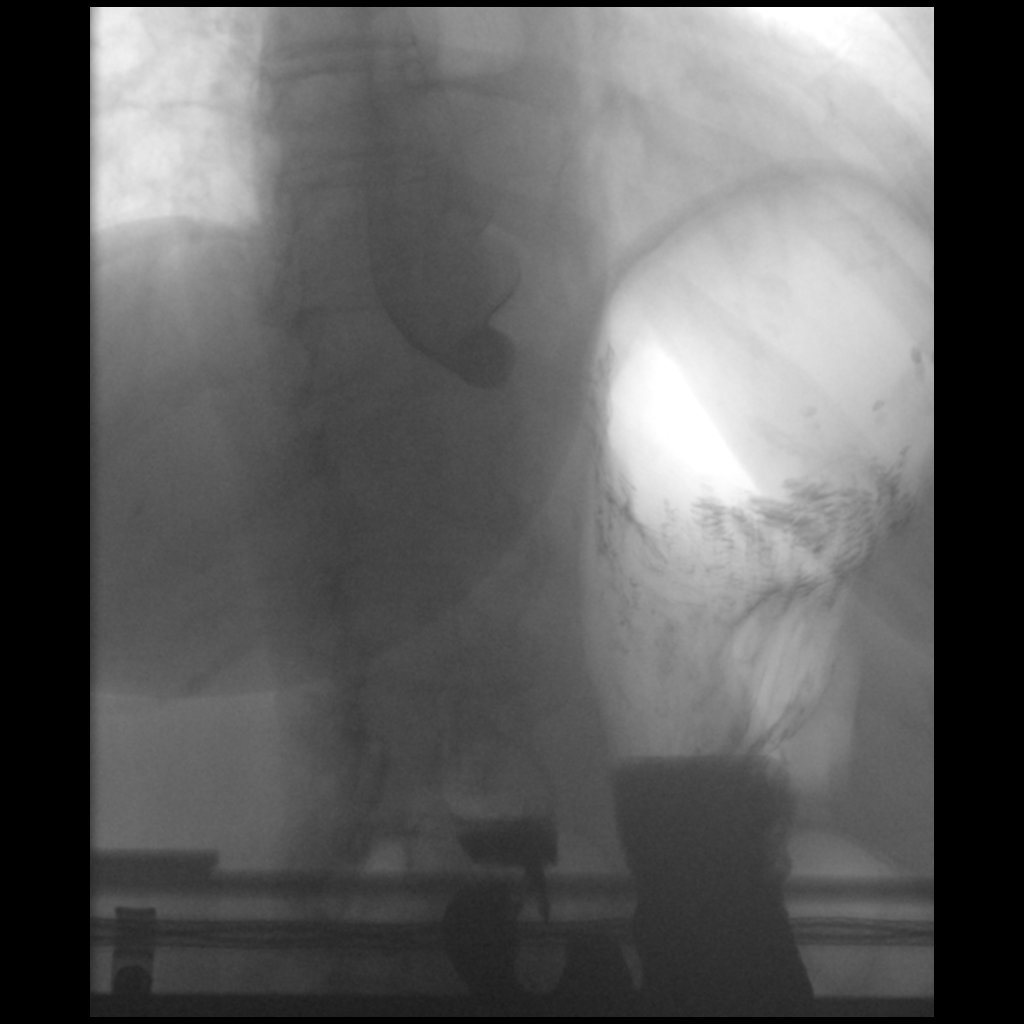

[Series 6: cp_standard · 0.38mm/px · 3 of 81 frames shown (5 of 6)]
[frame 13/81]
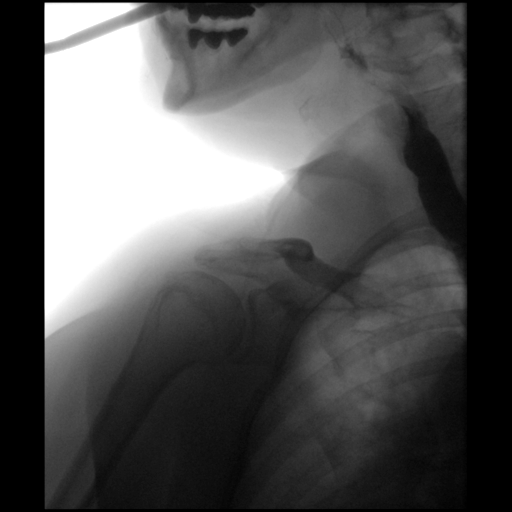
[frame 23/81]
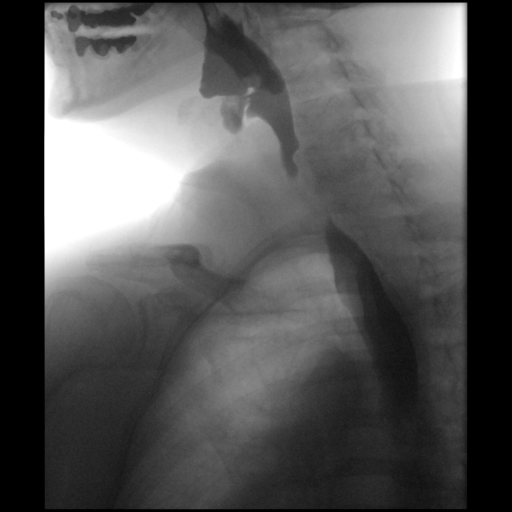
[frame 69/81]
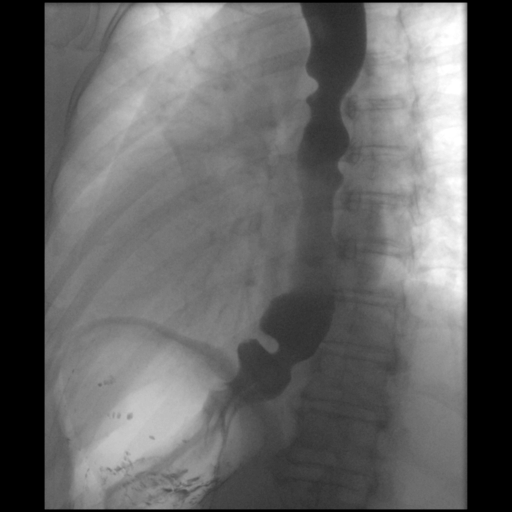

[Series 7: cp_standard · 0.38mm/px · 3 of 57 frames shown (6 of 6)]
[frame 9/57]
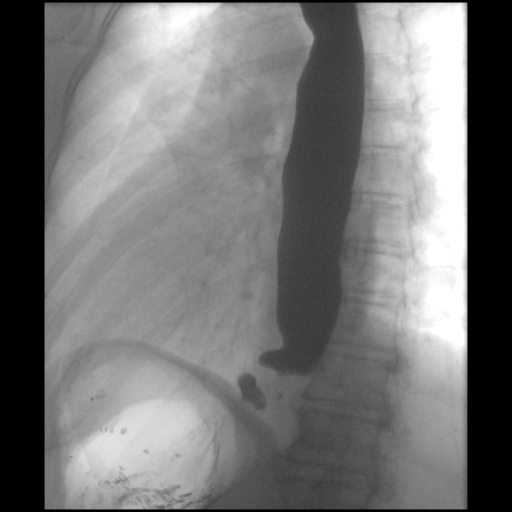
[frame 29/57]
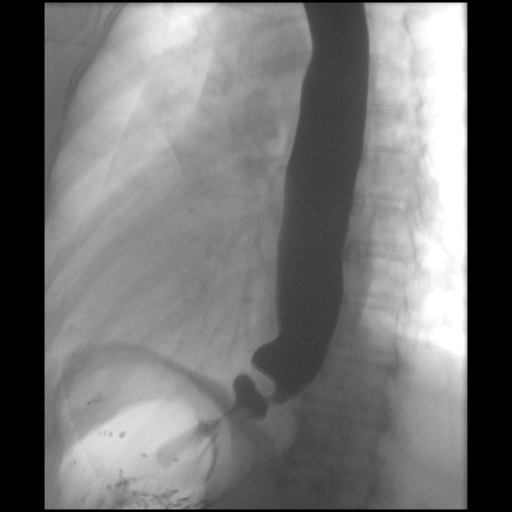
[frame 49/57]
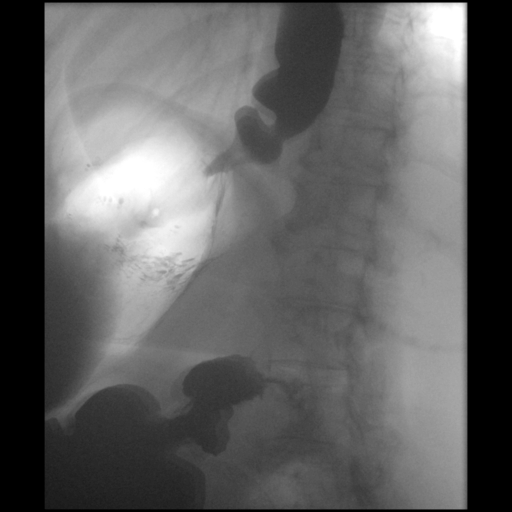

[15 of 21 positions shown; findings below may reference images not displayed]

FINDINGS: Moderate esophageal dysmotility. Negative for stricture or mass.
Hiatal hernia without gastroesophageal reflux. Pharyngeal phase of
swallowing normal.

Barium tablet passed readily into the stomach without delay.
IMPRESSION: Esophageal dysmotility

Small hiatal hernia without reflux.

Negative for stricture. Barium tablet passed readily into the
stomach.

## 2018-11-11 DIAGNOSIS — N3281 Overactive bladder: Secondary | ICD-10-CM | POA: Insufficient documentation

## 2018-11-11 DIAGNOSIS — R1310 Dysphagia, unspecified: Secondary | ICD-10-CM | POA: Insufficient documentation

## 2018-12-29 ENCOUNTER — Encounter: Payer: Self-pay | Admitting: Podiatry

## 2018-12-29 ENCOUNTER — Other Ambulatory Visit: Payer: Self-pay

## 2018-12-29 ENCOUNTER — Ambulatory Visit (INDEPENDENT_AMBULATORY_CARE_PROVIDER_SITE_OTHER): Payer: Medicare Other | Admitting: Podiatry

## 2018-12-29 DIAGNOSIS — D689 Coagulation defect, unspecified: Secondary | ICD-10-CM | POA: Diagnosis not present

## 2018-12-29 DIAGNOSIS — M79675 Pain in left toe(s): Secondary | ICD-10-CM | POA: Diagnosis not present

## 2018-12-29 DIAGNOSIS — M79674 Pain in right toe(s): Secondary | ICD-10-CM

## 2018-12-29 DIAGNOSIS — B351 Tinea unguium: Secondary | ICD-10-CM

## 2018-12-29 NOTE — Progress Notes (Signed)
Complaint:  Visit Type: Patient returns to my office for continued preventative foot care services. Complaint: Patient states" my nails have grown long and thick and become painful to walk and wear shoes" Patient is taking plavix.  The patient presents for preventative foot care services. No changes to ROS.  Patient is taking plavix.  Podiatric Exam: Vascular: dorsalis pedis and posterior tibial pulses are palpable bilateral. Capillary return is immediate. Temperature gradient is WNL. Skin turgor WNL  Sensorium: Normal Semmes Weinstein monofilament test. Normal tactile sensation bilaterally. Nail Exam: Pt has thick disfigured discolored nails with subungual debris noted bilateral entire nail hallux through fifth toenails Ulcer Exam: There is no evidence of ulcer or pre-ulcerative changes or infection. Orthopedic Exam: Muscle tone and strength are WNL. No limitations in general ROM. No crepitus or effusions noted. Bone spur second toe right. Skin: No Porokeratosis. No infection or ulcers.  Mucoid cyst second toe right foot.  Diagnosis:  Onychomycosis, , Pain in right toe, pain in left toes  Treatment & Plan Procedures and Treatment: Consent by patient was obtained for treatment procedures.   Debridement of mycotic and hypertrophic toenails, 1 through 5 bilateral and clearing of subungual debris. No ulceration, no infection noted. Patient to be scheduled in [place]. Return Visit-Office Procedure: Patient instructed to return to the office for a follow up visit 10 weeks  for continued evaluation and treatment.    Dontreal Miera DPM 

## 2019-03-13 ENCOUNTER — Encounter: Payer: Self-pay | Admitting: Podiatry

## 2019-03-13 ENCOUNTER — Other Ambulatory Visit: Payer: Self-pay

## 2019-03-13 ENCOUNTER — Ambulatory Visit: Payer: Medicare Other | Admitting: Podiatry

## 2019-03-13 DIAGNOSIS — M79675 Pain in left toe(s): Secondary | ICD-10-CM

## 2019-03-13 DIAGNOSIS — M79674 Pain in right toe(s): Secondary | ICD-10-CM

## 2019-03-13 DIAGNOSIS — B351 Tinea unguium: Secondary | ICD-10-CM | POA: Diagnosis not present

## 2019-03-13 DIAGNOSIS — D689 Coagulation defect, unspecified: Secondary | ICD-10-CM | POA: Diagnosis not present

## 2019-03-13 NOTE — Progress Notes (Signed)
Complaint:  Visit Type: Patient returns to my office for continued preventative foot care services. Complaint: Patient states" my nails have grown long and thick and become painful to walk and wear shoes" Patient is taking plavix.  The patient presents for preventative foot care services. No changes to ROS.  Patient is taking plavix.  Podiatric Exam: Vascular: dorsalis pedis and posterior tibial pulses are palpable bilateral. Capillary return is immediate. Temperature gradient is WNL. Skin turgor WNL  Sensorium: Normal Semmes Weinstein monofilament test. Normal tactile sensation bilaterally. Nail Exam: Pt has thick disfigured discolored nails with subungual debris noted bilateral entire nail hallux through fifth toenails Ulcer Exam: There is no evidence of ulcer or pre-ulcerative changes or infection. Orthopedic Exam: Muscle tone and strength are WNL. No limitations in general ROM. No crepitus or effusions noted. Bone spur second toe right. Skin: No Porokeratosis. No infection or ulcers.  Mucoid cyst second toe right foot.  Diagnosis:  Onychomycosis, , Pain in right toe, pain in left toes  Treatment & Plan Procedures and Treatment: Consent by patient was obtained for treatment procedures.   Debridement of mycotic and hypertrophic toenails, 1 through 5 bilateral and clearing of subungual debris. No ulceration, no infection noted. Patient to be scheduled in [place]. Return Visit-Office Procedure: Patient instructed to return to the office for a follow up visit 10 weeks  for continued evaluation and treatment.    Gardiner Barefoot DPM

## 2019-05-08 DIAGNOSIS — K029 Dental caries, unspecified: Secondary | ICD-10-CM | POA: Insufficient documentation

## 2019-05-22 ENCOUNTER — Encounter: Payer: Self-pay | Admitting: Podiatry

## 2019-05-22 ENCOUNTER — Ambulatory Visit: Payer: Medicare Other | Admitting: Podiatry

## 2019-05-22 ENCOUNTER — Other Ambulatory Visit: Payer: Self-pay

## 2019-05-22 DIAGNOSIS — D689 Coagulation defect, unspecified: Secondary | ICD-10-CM

## 2019-05-22 DIAGNOSIS — M79675 Pain in left toe(s): Secondary | ICD-10-CM

## 2019-05-22 DIAGNOSIS — M79674 Pain in right toe(s): Secondary | ICD-10-CM | POA: Diagnosis not present

## 2019-05-22 DIAGNOSIS — B351 Tinea unguium: Secondary | ICD-10-CM

## 2019-05-22 NOTE — Progress Notes (Signed)
Complaint:  Visit Type: Patient returns to my office for at risk foot care. . Patient requires this detailed paraprofessional for this patient will be at risk.  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  This patient is taking plavix.  This patient is unable to provide self nail care since she is unable to reach her feet.  She says that her nails are painful walking wearing her shoes.  She presents for at risk foot care today.  Podiatric Exam: Vascular: dorsalis pedis and posterior tibial pulses are palpable bilateral. Capillary return is immediate. Temperature gradient is WNL. Skin turgor WNL  Sensorium: Normal Semmes Weinstein monofilament test. Normal tactile sensation bilaterally. Nail Exam: Pt has thick disfigured discolored nails with subungual debris noted bilateral entire nail hallux through fifth toenails Ulcer Exam: There is no evidence of ulcer or pre-ulcerative changes or infection. Orthopedic Exam: Muscle tone and strength are WNL. No limitations in general ROM. No crepitus or effusions noted. Bone spur second toe right. Skin: No Porokeratosis. No infection or ulcers.  Mucoid cyst second toe right foot.  Diagnosis:  Onychomycosis, , Pain in right toe, pain in left toes  Treatment & Plan Procedures and Treatment: Consent by patient was obtained for treatment procedures.   Debridement of mycotic and hypertrophic toenails, 1 through 5 bilateral and clearing of subungual debris. No ulceration, no infection noted.  Told this patient importance of periodic foot evaluations.  This will help reduce the potential complications in her feet.   Return Visit-Office Procedure: Patient instructed to return to the office for a follow up visit 10 weeks  for continued evaluation and treatment.    Gardiner Barefoot DPM

## 2019-08-10 ENCOUNTER — Other Ambulatory Visit: Payer: Self-pay

## 2019-08-10 ENCOUNTER — Encounter: Payer: Self-pay | Admitting: Podiatry

## 2019-08-10 ENCOUNTER — Ambulatory Visit: Payer: Medicare Other | Admitting: Podiatry

## 2019-08-10 DIAGNOSIS — M79675 Pain in left toe(s): Secondary | ICD-10-CM

## 2019-08-10 DIAGNOSIS — M79674 Pain in right toe(s): Secondary | ICD-10-CM | POA: Diagnosis not present

## 2019-08-10 DIAGNOSIS — B351 Tinea unguium: Secondary | ICD-10-CM

## 2019-08-10 DIAGNOSIS — D689 Coagulation defect, unspecified: Secondary | ICD-10-CM

## 2019-08-10 NOTE — Progress Notes (Signed)
This patient returns to my office for at risk foot care.  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  This patient is unable to cut nails herself since the patient cannot reach her nails.These nails are painful walking and wearing shoes.  This patient presents for at risk foot care today.  General Appearance  Alert, conversant and in no acute stress.  Vascular  Dorsalis pedis and posterior tibial  pulses are palpable  bilaterally.  Capillary return is within normal limits  bilaterally. Temperature is within normal limits  bilaterally.  Neurologic  Senn-Weinstein monofilament wire test within normal limits  bilaterally. Muscle power within normal limits bilaterally.  Nails Thick disfigured discolored nails with subungual debris  from hallux to fifth toes bilaterally. No evidence of bacterial infection or drainage bilaterally.  Orthopedic  No limitations of motion  feet .  No crepitus or effusions noted.  No bony pathology or digital deformities noted.  Skin  normotropic skin with no porokeratosis noted bilaterally.  No signs of infections or ulcers noted.   Mucoid cyst second toe right.  Onychomycosis  Pain in right toes  Pain in left toes  Consent was obtained for treatment procedures.   Mechanical debridement of nails 1-5  bilaterally performed with a nail nipper.  Filed with dremel without incident.    Return office visit  10 weeks                    Told patient to return for periodic foot care and evaluation due to potential at risk complications.   Gardiner Barefoot DPM

## 2019-10-23 ENCOUNTER — Other Ambulatory Visit: Payer: Self-pay

## 2019-10-23 ENCOUNTER — Ambulatory Visit: Payer: Medicare Other | Admitting: Podiatry

## 2019-10-23 ENCOUNTER — Encounter: Payer: Self-pay | Admitting: Podiatry

## 2019-10-23 DIAGNOSIS — M79674 Pain in right toe(s): Secondary | ICD-10-CM | POA: Diagnosis not present

## 2019-10-23 DIAGNOSIS — M79675 Pain in left toe(s): Secondary | ICD-10-CM | POA: Diagnosis not present

## 2019-10-23 DIAGNOSIS — B351 Tinea unguium: Secondary | ICD-10-CM

## 2019-10-23 DIAGNOSIS — D689 Coagulation defect, unspecified: Secondary | ICD-10-CM

## 2019-10-23 NOTE — Progress Notes (Signed)
This patient returns to my office for at risk foot care.  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  This patient is unable to cut nails herself since the patient cannot reach her nails.These nails are painful walking and wearing shoes.  This patient presents for at risk foot care today.  General Appearance  Alert, conversant and in no acute stress.  Vascular  Dorsalis pedis and posterior tibial  pulses are palpable  bilaterally.  Capillary return is within normal limits  bilaterally. Temperature is within normal limits  bilaterally.  Neurologic  Senn-Weinstein monofilament wire test within normal limits  bilaterally. Muscle power within normal limits bilaterally.  Nails Thick disfigured discolored nails with subungual debris  from hallux to fifth toes bilaterally. No evidence of bacterial infection or drainage bilaterally.  Orthopedic  No limitations of motion  feet .  No crepitus or effusions noted.  No bony pathology or digital deformities noted.  Skin  normotropic skin with no porokeratosis noted bilaterally.  No signs of infections or ulcers noted.   Mucoid cyst second toe right.  Onychomycosis  Pain in right toes  Pain in left toes  Consent was obtained for treatment procedures.   Mechanical debridement of nails 1-5  bilaterally performed with a nail nipper.  Filed with dremel without incident.    Return office visit  11 weeks                    Told patient to return for periodic foot care and evaluation due to potential at risk complications.   Gardiner Barefoot DPM

## 2020-01-08 ENCOUNTER — Ambulatory Visit: Payer: Medicare Other | Admitting: Podiatry

## 2020-01-08 ENCOUNTER — Encounter: Payer: Self-pay | Admitting: Podiatry

## 2020-01-08 ENCOUNTER — Other Ambulatory Visit: Payer: Self-pay

## 2020-01-08 DIAGNOSIS — M79675 Pain in left toe(s): Secondary | ICD-10-CM

## 2020-01-08 DIAGNOSIS — B351 Tinea unguium: Secondary | ICD-10-CM | POA: Diagnosis not present

## 2020-01-08 DIAGNOSIS — D689 Coagulation defect, unspecified: Secondary | ICD-10-CM | POA: Diagnosis not present

## 2020-01-08 DIAGNOSIS — M79674 Pain in right toe(s): Secondary | ICD-10-CM | POA: Diagnosis not present

## 2020-01-08 NOTE — Progress Notes (Signed)
This patient returns to my office for at risk foot care.  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  This patient is unable to cut nails herself since the patient cannot reach her nails.These nails are painful walking and wearing shoes.  This patient presents for at risk foot care today.  General Appearance  Alert, conversant and in no acute stress.  Vascular  Dorsalis pedis and posterior tibial  pulses are palpable  bilaterally.  Capillary return is within normal limits  bilaterally. Temperature is within normal limits  bilaterally.  Neurologic  Senn-Weinstein monofilament wire test within normal limits  bilaterally. Muscle power within normal limits bilaterally.  Nails Thick disfigured discolored nails with subungual debris  from hallux to fifth toes bilaterally. No evidence of bacterial infection or drainage bilaterally.  Orthopedic  No limitations of motion  feet .  No crepitus or effusions noted.  No bony pathology or digital deformities noted.  Midfoot arthritis  B/L.  Skin  normotropic skin with no porokeratosis noted bilaterally.  No signs of infections or ulcers noted.   Mucoid cyst second toe right.  Onychomycosis  Pain in right toes  Pain in left toes  Consent was obtained for treatment procedures.   Mechanical debridement of nails 1-5  bilaterally performed with a nail nipper.  Filed with dremel without incident.    Return office visit  12 weeks                    Told patient to return for periodic foot care and evaluation due to potential at risk complications.   Gardiner Barefoot DPM

## 2020-04-11 ENCOUNTER — Ambulatory Visit: Payer: Medicare Other | Admitting: Podiatry

## 2020-04-15 ENCOUNTER — Other Ambulatory Visit: Payer: Self-pay

## 2020-04-15 ENCOUNTER — Encounter: Payer: Self-pay | Admitting: Podiatry

## 2020-04-15 ENCOUNTER — Ambulatory Visit: Payer: Medicare Other | Admitting: Podiatry

## 2020-04-15 DIAGNOSIS — M79675 Pain in left toe(s): Secondary | ICD-10-CM | POA: Diagnosis not present

## 2020-04-15 DIAGNOSIS — B351 Tinea unguium: Secondary | ICD-10-CM

## 2020-04-15 DIAGNOSIS — M79674 Pain in right toe(s): Secondary | ICD-10-CM

## 2020-04-15 DIAGNOSIS — D689 Coagulation defect, unspecified: Secondary | ICD-10-CM

## 2020-04-15 NOTE — Progress Notes (Signed)
This patient returns to my office for at risk foot care.  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  This patient is unable to cut nails herself since the patient cannot reach her nails.These nails are painful walking and wearing shoes.  This patient presents for at risk foot care today.  General Appearance  Alert, conversant and in no acute stress.  Vascular  Dorsalis pedis and posterior tibial  pulses are palpable  bilaterally.  Capillary return is within normal limits  bilaterally. Temperature is within normal limits  bilaterally.  Neurologic  Senn-Weinstein monofilament wire test within normal limits  bilaterally. Muscle power within normal limits bilaterally.  Nails Thick disfigured discolored nails with subungual debris  from hallux to fifth toes bilaterally. No evidence of bacterial infection or drainage bilaterally.  Orthopedic  No limitations of motion  feet .  No crepitus or effusions noted.  No bony pathology or digital deformities noted.  Midfoot arthritis  B/L.  Skin  normotropic skin with no porokeratosis noted bilaterally.  No signs of infections or ulcers noted.   Mucoid cyst second toe right.  Onychomycosis  Pain in right toes  Pain in left toes  Consent was obtained for treatment procedures.   Mechanical debridement of nails 1-5  bilaterally performed with a nail nipper.  Filed with dremel without incident.    Return office visit  12 weeks                    Told patient to return for periodic foot care and evaluation due to potential at risk complications.   Crystallynn Noorani DPM  

## 2020-04-30 ENCOUNTER — Encounter: Payer: Self-pay | Admitting: Gastroenterology

## 2020-07-11 ENCOUNTER — Other Ambulatory Visit: Payer: Self-pay

## 2020-07-11 ENCOUNTER — Encounter: Payer: Self-pay | Admitting: Podiatry

## 2020-07-11 ENCOUNTER — Ambulatory Visit: Payer: Medicare Other | Admitting: Podiatry

## 2020-07-11 DIAGNOSIS — M79675 Pain in left toe(s): Secondary | ICD-10-CM

## 2020-07-11 DIAGNOSIS — B351 Tinea unguium: Secondary | ICD-10-CM | POA: Diagnosis not present

## 2020-07-11 DIAGNOSIS — M79674 Pain in right toe(s): Secondary | ICD-10-CM

## 2020-07-11 DIAGNOSIS — D689 Coagulation defect, unspecified: Secondary | ICD-10-CM | POA: Diagnosis not present

## 2020-07-11 NOTE — Progress Notes (Signed)
This patient returns to my office for at risk foot care.  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  This patient is unable to cut nails herself since the patient cannot reach her nails.These nails are painful walking and wearing shoes.  This patient presents for at risk foot care today.  General Appearance  Alert, conversant and in no acute stress.  Vascular  Dorsalis pedis and posterior tibial  pulses are palpable  bilaterally.  Capillary return is within normal limits  bilaterally. Temperature is within normal limits  bilaterally.  Neurologic  Senn-Weinstein monofilament wire test within normal limits  bilaterally. Muscle power within normal limits bilaterally.  Nails Thick disfigured discolored nails with subungual debris  from hallux to fifth toes bilaterally. No evidence of bacterial infection or drainage bilaterally.  Orthopedic  No limitations of motion  feet .  No crepitus or effusions noted.  No bony pathology or digital deformities noted.  Midfoot arthritis  B/L.  Skin  normotropic skin with no porokeratosis noted bilaterally.  No signs of infections or ulcers noted.   Mucoid cyst second toe right.  Onychomycosis  Pain in right toes  Pain in left toes  Consent was obtained for treatment procedures.   Mechanical debridement of nails 1-5  bilaterally performed with a nail nipper.  Filed with dremel without incident.    Return office visit  10 weeks                    Told patient to return for periodic foot care and evaluation due to potential at risk complications.   Gardiner Barefoot DPM

## 2020-07-15 ENCOUNTER — Ambulatory Visit: Payer: Medicare Other | Admitting: Podiatry

## 2020-09-19 ENCOUNTER — Ambulatory Visit: Payer: Medicare Other | Admitting: Podiatry

## 2020-09-19 ENCOUNTER — Other Ambulatory Visit: Payer: Self-pay

## 2020-09-19 ENCOUNTER — Encounter: Payer: Self-pay | Admitting: Podiatry

## 2020-09-19 DIAGNOSIS — B351 Tinea unguium: Secondary | ICD-10-CM | POA: Diagnosis not present

## 2020-09-19 DIAGNOSIS — M79675 Pain in left toe(s): Secondary | ICD-10-CM | POA: Diagnosis not present

## 2020-09-19 DIAGNOSIS — M79674 Pain in right toe(s): Secondary | ICD-10-CM

## 2020-09-19 DIAGNOSIS — D689 Coagulation defect, unspecified: Secondary | ICD-10-CM | POA: Diagnosis not present

## 2020-09-19 NOTE — Progress Notes (Signed)
This patient returns to my office for at risk foot care.  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  Patient is taking plavix.   This patient is unable to cut nails herself since the patient cannot reach her nails.These nails are painful walking and wearing shoes.  This patient presents for at risk foot care today.  General Appearance  Alert, conversant and in no acute stress.  Vascular  Dorsalis pedis and posterior tibial  pulses are palpable  bilaterally.  Capillary return is within normal limits  bilaterally. Temperature is within normal limits  bilaterally.  Neurologic  Senn-Weinstein monofilament wire test within normal limits  bilaterally. Muscle power within normal limits bilaterally.  Nails Thick disfigured discolored nails with subungual debris  from hallux to fifth toes bilaterally. No evidence of bacterial infection or drainage bilaterally.  Orthopedic  No limitations of motion  feet .  No crepitus or effusions noted.  No bony pathology or digital deformities noted.  Midfoot arthritis  B/L.  Skin  normotropic skin with no porokeratosis noted bilaterally.  No signs of infections or ulcers noted.   Mucoid cyst second toe right.  Onychomycosis  Pain in right toes  Pain in left toes  Consent was obtained for treatment procedures.   Mechanical debridement of nails 1-5  bilaterally performed with a nail nipper.  Filed with dremel without incident.    Return office visit  10 weeks                    Told patient to return for periodic foot care and evaluation due to potential at risk complications.   Nezzie Manera DPM  

## 2020-11-28 ENCOUNTER — Other Ambulatory Visit: Payer: Self-pay

## 2020-11-28 ENCOUNTER — Encounter: Payer: Self-pay | Admitting: Podiatry

## 2020-11-28 ENCOUNTER — Encounter (INDEPENDENT_AMBULATORY_CARE_PROVIDER_SITE_OTHER): Payer: Self-pay

## 2020-11-28 ENCOUNTER — Ambulatory Visit: Payer: Medicare Other | Admitting: Podiatry

## 2020-11-28 DIAGNOSIS — M79674 Pain in right toe(s): Secondary | ICD-10-CM | POA: Diagnosis not present

## 2020-11-28 DIAGNOSIS — D689 Coagulation defect, unspecified: Secondary | ICD-10-CM | POA: Diagnosis not present

## 2020-11-28 DIAGNOSIS — B351 Tinea unguium: Secondary | ICD-10-CM

## 2020-11-28 DIAGNOSIS — M79675 Pain in left toe(s): Secondary | ICD-10-CM

## 2020-11-28 NOTE — Progress Notes (Signed)
This patient returns to my office for at risk foot care.  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  Patient is taking plavix.   This patient is unable to cut nails herself since the patient cannot reach her nails.These nails are painful walking and wearing shoes.  This patient presents for at risk foot care today.  General Appearance  Alert, conversant and in no acute stress.  Vascular  Dorsalis pedis and posterior tibial  pulses are palpable  bilaterally.  Capillary return is within normal limits  bilaterally. Temperature is within normal limits  bilaterally.  Neurologic  Senn-Weinstein monofilament wire test within normal limits  bilaterally. Muscle power within normal limits bilaterally.  Nails Thick disfigured discolored nails with subungual debris  from hallux to fifth toes bilaterally. No evidence of bacterial infection or drainage bilaterally.  Orthopedic  No limitations of motion  feet .  No crepitus or effusions noted.  No bony pathology or digital deformities noted.  Midfoot arthritis  B/L.  Skin  normotropic skin with no porokeratosis noted bilaterally.  No signs of infections or ulcers noted.   Mucoid cyst second toe right.  Onychomycosis  Pain in right toes  Pain in left toes  Consent was obtained for treatment procedures.   Mechanical debridement of nails 1-5  bilaterally performed with a nail nipper.  Filed with dremel without incident.    Return office visit  10 weeks                    Told patient to return for periodic foot care and evaluation due to potential at risk complications.   Aveer Bartow DPM  

## 2021-02-10 ENCOUNTER — Encounter: Payer: Self-pay | Admitting: Emergency Medicine

## 2021-02-10 ENCOUNTER — Emergency Department
Admission: EM | Admit: 2021-02-10 | Discharge: 2021-02-10 | Disposition: A | Discharge status: Home / Self Care | Payer: Medicare Other | Attending: Emergency Medicine | Admitting: Emergency Medicine

## 2021-02-10 ENCOUNTER — Emergency Department: Payer: Medicare Other

## 2021-02-10 DIAGNOSIS — W19XXXA Unspecified fall, initial encounter: Secondary | ICD-10-CM

## 2021-02-10 DIAGNOSIS — I10 Essential (primary) hypertension: Secondary | ICD-10-CM | POA: Insufficient documentation

## 2021-02-10 DIAGNOSIS — S0990XA Unspecified injury of head, initial encounter: Secondary | ICD-10-CM

## 2021-02-10 DIAGNOSIS — W01198A Fall on same level from slipping, tripping and stumbling with subsequent striking against other object, initial encounter: Secondary | ICD-10-CM | POA: Insufficient documentation

## 2021-02-10 DIAGNOSIS — S0083XA Contusion of other part of head, initial encounter: Secondary | ICD-10-CM | POA: Diagnosis not present

## 2021-02-10 DIAGNOSIS — Z96651 Presence of right artificial knee joint: Secondary | ICD-10-CM | POA: Insufficient documentation

## 2021-02-10 DIAGNOSIS — S40022A Contusion of left upper arm, initial encounter: Secondary | ICD-10-CM | POA: Insufficient documentation

## 2021-02-10 DIAGNOSIS — Z7902 Long term (current) use of antithrombotics/antiplatelets: Secondary | ICD-10-CM

## 2021-02-10 DIAGNOSIS — Z79899 Other long term (current) drug therapy: Secondary | ICD-10-CM | POA: Diagnosis not present

## 2021-02-10 DIAGNOSIS — Z7982 Long term (current) use of aspirin: Secondary | ICD-10-CM | POA: Insufficient documentation

## 2021-02-10 NOTE — ED Provider Notes (Signed)
Emergency Medicine Provider Triage Evaluation Note  Lisa Porter , a 80 y.o. female  was evaluated in triage.  Pt complains of Hematoma to the forehead and bilateral raccoon eyes after treatment at home 4 days ago.  Patient was on Plavix, denies any serious head injury or LOC, or facial laceration.  She had not intended on being evaluated since she notes that the hematoma came out on her forehead, and she developed bilateral ecchymosis to the eyes as expected.  She denies any other serious injury or complaints related to the fall.  She was advised to come to the ED at the advice of her gynecologist, after routine visit this morning.  Review of Systems  Positive: Closed head injury Negative: LOC, N/V  Physical Exam  BP (!) 176/68   Pulse 76   Temp 98.2 F (36.8 C) (Oral)   Resp 18   Wt 70 kg   SpO2 99%   BMI 29.17 kg/m  Gen:   Awake, no distress  NAD Resp:  Normal effort CTA MSK:   Moves extremities without difficulty  Other:  ENT: Preorbital ecchymosis, PERRLA  Medical Decision Making  Medically screening exam initiated at 11:48 AM.  Appropriate orders placed.  Lisa Porter was informed that the remainder of the evaluation will be completed by another provider, this initial triage assessment does not replace that evaluation, and the importance of remaining in the ED until their evaluation is complete.  Geriatric patient on Plavix, presented to the ED 4 days after a fall at home with close head injury.  She presents with resolving forehead hematoma and bilateral periorbital ecchymosis.  No other complaints related to the fall.   [name], PA-C 02/10/21 1151    Vladimir Crofts, MD 02/10/21 1410

## 2021-02-10 NOTE — ED Triage Notes (Signed)
Pt in with bruising and hematoma to L forehead, and bilateral racoon eyes after trip fall going up stairs 4 days ago. Pt on Plavix, denies any LOC. States she went to annual gynecology appt today, and they sent her to ED for eval. Denies any n/v or other symptoms. BP 176/68 in triage.

## 2021-02-10 NOTE — ED Provider Notes (Signed)
[facility] Emergency Department Provider Note ____________________________________________   Event Date/Time   First MD Initiated Contact with Patient 02/10/21 1318     (approximate)  I have reviewed the triage vital signs and the nursing notes.  HISTORY  Chief Complaint Fall and Head Injury   HPI Lisa Porter is a 80 y.o. femalewho presents to the ED for evaluation of fall and head injury.   Chart review indicates DAPT with Plavix.  Patient presents to the ED, accompanied by her husband, for evaluation of facial bruising and head injury after a fall that occurred 4 days ago.  She reports tripping and falling at home.  They have a split-level home and she is to take 3 steps to go between the living room in the kitchen.  She reports her feet getting tripped up, falling forward and striking her face, left upper arm on the steps and the threshold into the other room.  No syncope, no subsequent falls, no weakness, vision changes, fever, pain with mastication.   She saw an OB/GYN as an outpatient this morning and they urged her to get evaluated due to the significant bruising obviously on her face.  Past Medical History:  Diagnosis Date   Arthritis    GERD (gastroesophageal reflux disease)    Hyperlipemia    Hypertension    Plantar fasciitis of right foot    Stroke ([place]) 2000   no defecits/  TIA  2012    Patient Active Problem List   Diagnosis Date Noted   Left-sided low back pain with left-sided sciatica 04/21/2017   Arthritis of knee, right 01/27/2013   HYPERLIPIDEMIA 06/16/2007   ANXIETY 06/16/2007   HYPERTENSION 06/16/2007   HEMORRHOIDS 06/16/2007   DIVERTICULOSIS, COLON 06/16/2007   ARTHRITIS 06/16/2007   CEREBROVASCULAR ACCIDENT, HX OF 06/16/2007    Past Surgical History:  Procedure Laterality Date   BREAST SURGERY Right    biopsy x 3   CHOLECYSTECTOMY     EYE SURGERY Left    cataract extraction with IOL   TOTAL KNEE ARTHROPLASTY  Right 01/27/2013   Procedure: RIGHT TOTAL KNEE ARTHROPLASTY;  Surgeon: Mcarthur Rossetti, MD;  Location: [facility] ORS;  Service: Orthopedics;  Laterality: Right;    Prior to Admission medications   Medication Sig Start Date End Date Taking? Authorizing Provider  acetaminophen (TYLENOL) 500 MG tablet Take 500 mg by mouth every morning.    [provider]  acetaminophen (TYLENOL) 650 MG CR tablet Take 650 mg by mouth at bedtime.    [provider]  aspirin EC 81 MG tablet Take 81 mg by mouth at bedtime.    [provider]  atorvastatin (LIPITOR) 80 MG tablet Take 80 mg by mouth at bedtime.    [provider]  CINNAMON PO Take 1,000 mg by mouth 2 (two) times daily.    [provider]  clopidogrel (PLAVIX) 75 MG tablet Take 75 mg by mouth at bedtime. States Will stop 01/20/13    [provider]  fluticasone (FLONASE) 50 MCG/ACT nasal spray Place 1 spray into the nose 2 (two) times daily as needed.     [provider]  gabapentin (NEURONTIN) 100 MG capsule Take by mouth at bedtime. 01/10/18   [provider]  guaiFENesin (MUCINEX) 600 MG 12 hr tablet Take 1,200 mg by mouth 2 (two) times daily as needed.     [provider]  hydrochlorothiazide (MICROZIDE) 12.5 MG capsule TAKE ONE CAPSULE BY MOUTH EVERY DAY WITH  QUINAPRIL 12/16/17   [provider]  Multiple Vitamin (MULTIVITAMIN WITH MINERALS) TABS tablet Take 1 tablet by mouth at bedtime.    [provider]  omeprazole (PRILOSEC) 40 MG capsule Take 40 mg by mouth daily.    [provider]  OVER THE COUNTER MEDICATION Take 1 tablet by mouth 2 (two) times daily. Glucosamine Chondroitin 1500mg /1200mg     [provider]  phenylephrine (NEO-SYNEPHRINE) 1 % nasal spray Place 1 drop into both nostrils every 6 (six) hours as needed for congestion.    [provider]  quinapril (ACCUPRIL) 40 MG tablet Take 40 mg by mouth at bedtime.     [provider]  vitamin B-12 (CYANOCOBALAMIN) 500 MCG tablet Take 500 mcg by mouth every morning.    [provider]  vitamin C (ASCORBIC ACID) 500 MG tablet Take 500 mg by mouth 2 (two) times daily.    [provider]    Allergies Tape and Sulfa antibiotics  Family History  Problem Relation Age of Onset   Heart failure Mother    Hypertension Father    CAD Brother    Hypertension Brother    Melanoma Sister        leg   Colon cancer Neg Hx    Esophageal cancer Neg Hx    Stomach cancer Neg Hx     Social History Social History   Tobacco Use   Smoking status: Never   Smokeless tobacco: Never  Substance Use Topics   Alcohol use: No   Drug use: No    Review of Systems  Constitutional: No fever/chills Eyes: No visual changes. ENT: No sore throat. Cardiovascular: Denies chest pain. Respiratory: Denies shortness of breath. Gastrointestinal: No abdominal pain.  No nausea, no vomiting.  No diarrhea.  No constipation. Genitourinary: Negative for dysuria. Musculoskeletal: Negative for back pain. Positive for fall and pain from this. Skin: Negative for rash. Neurological: Negative for headaches, focal weakness or numbness.  ____________________________________________   PHYSICAL EXAM:  VITAL SIGNS: Vitals:   02/10/21 1141  BP: (!) 176/68  Pulse: 76  Resp: 18  Temp: 98.2 F (36.8 C)  SpO2: 99%    Constitutional: Alert and oriented. Well appearing and in no acute distress.  Pleasant and conversational in full sentences.  Ambulatory with a cane at baseline. Eyes: Conjunctivae are normal. PERRL. EOMI. Head: Bruising to bilateral eyes and hematoma to left-sided forehead.  This is closed without evidence of laceration or bleeding. Minimal periorbital tenderness without focal features or bony step-offs. Pupils midrange and PERRL without evidence of subconjunctival hematoma, no evidence of EOM entrapment Nose: No  congestion/rhinnorhea. Mouth/Throat: Mucous membranes are moist.  Oropharynx non-erythematous. Neck: No stridor. No cervical spine tenderness to palpation. Cardiovascular: Normal rate, regular rhythm. Grossly normal heart sounds.  Good peripheral circulation. Respiratory: Normal respiratory effort.  No retractions. Lungs CTAB. Gastrointestinal: Soft , nondistended, nontender to palpation. No CVA tenderness. Musculoskeletal: No lower extremity tenderness nor edema.  No joint effusions.  Bruising to the lateral and dorsal aspect of the left upper arm, just superior to the elbow.  Full elbow and shoulder passive and active range of motion.  Left arm is distally neurovascularly intact. Neurologic:  Normal speech and language. No gross focal neurologic deficits are appreciated. No gait instability noted. Cranial nerves II through XII intact 5/5 strength and sensation in all 4 extremities Skin:  Skin is warm, dry and intact. No rash noted. Psychiatric: Mood and affect are normal. Speech and behavior are normal. ____________________________________________  LABS (all labs ordered are listed, but only abnormal results are displayed)  Labs Reviewed - No data to display ____________________________________________  12 Lead EKG   ____________________________________________  RADIOLOGY  ED MD interpretation:   CT head reviewed by me without evidence of acute intracranial pathology.  Official radiology report(s): CT Head Wo Contrast  Result Date: 02/10/2021 CLINICAL DATA:  None EXAM: CT HEAD WITHOUT CONTRAST CT CERVICAL SPINE WITHOUT CONTRAST TECHNIQUE: Multidetector CT imaging of the head and cervical spine was performed following the standard protocol without intravenous contrast. Multiplanar CT image reconstructions of the cervical spine were also generated. COMPARISON:  None. FINDINGS: CT HEAD FINDINGS Brain: Diffuse global atrophy which is likely age related. Chronic white matter ischemic  change no evidence of acute infarction, hemorrhage, hydrocephalus, extra-axial collection or mass lesion/mass effect. Vascular: No hyperdense vessel or unexpected calcification. Skull: Normal. Negative for fracture or focal lesion. Sinuses/Orbits: No acute finding. Other: Focal superficial soft tissue swelling overlying the left frontal bone CT CERVICAL SPINE FINDINGS Alignment: Mild grade 1 anterolisthesis of C2 on C3 and C3 on C4, likely degenerative. Skull base and vertebrae: No acute fracture. No primary bone lesion or focal pathologic process. Soft tissues and spinal canal: No prevertebral fluid or swelling. No visible canal hematoma. Disc levels:  Multilevel moderate degenerative disc disease. Upper chest: Negative. Other: None. IMPRESSION: 1. No acute intracranial abnormality. 2. No CT evidence of acute cervical spine injury. Electronically Signed   By: Yetta Glassman M.D.   On: 02/10/2021 12:25   CT Cervical Spine Wo Contrast  Result Date: 02/10/2021 CLINICAL DATA:  None EXAM: CT HEAD WITHOUT CONTRAST CT CERVICAL SPINE WITHOUT CONTRAST TECHNIQUE: Multidetector CT imaging of the head and cervical spine was performed following the standard protocol without intravenous contrast. Multiplanar CT image reconstructions of the cervical spine were also generated. COMPARISON:  None. FINDINGS: CT HEAD FINDINGS Brain: Diffuse global atrophy which is likely age related. Chronic white matter ischemic change no evidence of acute infarction, hemorrhage, hydrocephalus, extra-axial collection or mass lesion/mass effect. Vascular: No hyperdense vessel or unexpected calcification. Skull: Normal. Negative for fracture or focal lesion. Sinuses/Orbits: No acute finding. Other: Focal superficial soft tissue swelling overlying the left frontal bone CT CERVICAL SPINE FINDINGS Alignment: Mild grade 1 anterolisthesis of C2 on C3 and C3 on C4, likely degenerative. Skull base and vertebrae: No acute fracture. No primary bone  lesion or focal pathologic process. Soft tissues and spinal canal: No prevertebral fluid or swelling. No visible canal hematoma. Disc levels:  Multilevel moderate degenerative disc disease. Upper chest: Negative. Other: None. IMPRESSION: 1. No acute intracranial abnormality. 2. No CT evidence of acute cervical spine injury. Electronically Signed   By: Yetta Glassman M.D.   On: 02/10/2021 12:25    ____________________________________________   PROCEDURES and INTERVENTIONS  Procedure(s) performed (including Critical Care):  Procedures  Medications - No data to display  ____________________________________________   MDM / ED COURSE   80 year old woman presents to the ED a few days after mechanical fall while on DAPT, without evidence of ICH, fracture or significant derangements, and amenable to outpatient management.  Looks clinically well, though obviously has bruising throughout her face and left-sided forehead.  No signs of EOM entrapment or facial fractures, no laceration to require repair.  CT head and neck without evidence of ICH or fracture.  Has some bruising to her left upper arm, but no indications for imaging considering minimal pain and full range of motion of the joints.  We will discharge with return  precautions and recommendations to follow-up sooner if she were to fall again     ____________________________________________   FINAL CLINICAL IMPRESSION(S) / ED DIAGNOSES  Final diagnoses:  Platelet inhibition due to Plavix  Fall, initial encounter  Injury of head, initial encounter  Facial bruising, initial encounter     ED Discharge Orders     None        Daevion Navarette Tamala Julian   Note:  This document was prepared using Dragon voice recognition software and may include unintentional dictation errors.    Vladimir Crofts, MD 02/10/21 [phone]

## 2021-02-10 NOTE — Discharge Instructions (Addendum)
Use Tylenol for pain and fevers.  Up to 1000 mg per dose, up to 4 times per day.  Do not take more than 4000 mg of Tylenol/acetaminophen within 24 hours..  

## 2021-02-27 ENCOUNTER — Ambulatory Visit: Payer: Medicare Other | Admitting: Podiatry

## 2021-04-03 ENCOUNTER — Encounter: Payer: Self-pay | Admitting: Podiatry

## 2021-04-03 ENCOUNTER — Ambulatory Visit: Payer: Medicare Other | Admitting: Podiatry

## 2021-04-03 ENCOUNTER — Other Ambulatory Visit: Payer: Self-pay

## 2021-04-03 DIAGNOSIS — M79674 Pain in right toe(s): Secondary | ICD-10-CM

## 2021-04-03 DIAGNOSIS — B351 Tinea unguium: Secondary | ICD-10-CM | POA: Diagnosis not present

## 2021-04-03 DIAGNOSIS — D692 Other nonthrombocytopenic purpura: Secondary | ICD-10-CM | POA: Insufficient documentation

## 2021-04-03 DIAGNOSIS — M79675 Pain in left toe(s): Secondary | ICD-10-CM | POA: Diagnosis not present

## 2021-04-03 DIAGNOSIS — D689 Coagulation defect, unspecified: Secondary | ICD-10-CM

## 2021-04-03 NOTE — Progress Notes (Signed)
This patient returns to my office for at risk foot care.  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  Patient is taking plavix.   This patient is unable to cut nails herself since the patient cannot reach her nails.These nails are painful walking and wearing shoes.  This patient presents for at risk foot care today.  General Appearance  Alert, conversant and in no acute stress.  Vascular  Dorsalis pedis and posterior tibial  pulses are palpable  bilaterally.  Capillary return is within normal limits  bilaterally. Temperature is within normal limits  bilaterally.  Neurologic  Senn-Weinstein monofilament wire test within normal limits  bilaterally. Muscle power within normal limits bilaterally.  Nails Thick disfigured discolored nails with subungual debris  from hallux to fifth toes bilaterally. No evidence of bacterial infection or drainage bilaterally.  Orthopedic  No limitations of motion  feet .  No crepitus or effusions noted.  No bony pathology or digital deformities noted.  Midfoot arthritis  B/L.  Skin  normotropic skin with no porokeratosis noted bilaterally.  No signs of infections or ulcers noted.   Mucoid cyst second toe right.  Onychomycosis  Pain in right toes  Pain in left toes  Consent was obtained for treatment procedures.   Mechanical debridement of nails 1-5  bilaterally performed with a nail nipper.  Filed with dremel without incident.    Return office visit  10 weeks                    Told patient to return for periodic foot care and evaluation due to potential at risk complications.   Jaxsin Bottomley DPM  

## 2021-07-03 ENCOUNTER — Encounter: Payer: Self-pay | Admitting: Podiatry

## 2021-07-03 ENCOUNTER — Ambulatory Visit: Payer: Medicare Other | Admitting: Podiatry

## 2021-07-03 DIAGNOSIS — M79675 Pain in left toe(s): Secondary | ICD-10-CM | POA: Diagnosis not present

## 2021-07-03 DIAGNOSIS — B351 Tinea unguium: Secondary | ICD-10-CM | POA: Diagnosis not present

## 2021-07-03 DIAGNOSIS — M79674 Pain in right toe(s): Secondary | ICD-10-CM

## 2021-07-03 DIAGNOSIS — D689 Coagulation defect, unspecified: Secondary | ICD-10-CM

## 2021-07-03 NOTE — Progress Notes (Signed)
This patient returns to my office for at risk foot care.  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  Patient is taking plavix.   This patient is unable to cut nails herself since the patient cannot reach her nails.These nails are painful walking and wearing shoes.  This patient presents for at risk foot care today.  General Appearance  Alert, conversant and in no acute stress.  Vascular  Dorsalis pedis and posterior tibial  pulses are palpable  bilaterally.  Capillary return is within normal limits  bilaterally. Temperature is within normal limits  bilaterally.  Neurologic  Senn-Weinstein monofilament wire test within normal limits  bilaterally. Muscle power within normal limits bilaterally.  Nails Thick disfigured discolored nails with subungual debris  from hallux to fifth toes bilaterally. No evidence of bacterial infection or drainage bilaterally.  Orthopedic  No limitations of motion  feet .  No crepitus or effusions noted.  No bony pathology or digital deformities noted.  Midfoot arthritis  B/L.  Skin  normotropic skin with no porokeratosis noted bilaterally.  No signs of infections or ulcers noted.   Mucoid cyst second toe right.  Onychomycosis  Pain in right toes  Pain in left toes  Consent was obtained for treatment procedures.   Mechanical debridement of nails 1-5  bilaterally performed with a nail nipper.  Filed with dremel without incident.    Return office visit  10 weeks                    Told patient to return for periodic foot care and evaluation due to potential at risk complications.   Avabella Wailes DPM  

## 2021-10-13 ENCOUNTER — Ambulatory Visit: Payer: Medicare Other | Admitting: Podiatry

## 2021-10-13 ENCOUNTER — Encounter: Payer: Self-pay | Admitting: Podiatry

## 2021-10-13 DIAGNOSIS — M79675 Pain in left toe(s): Secondary | ICD-10-CM

## 2021-10-13 DIAGNOSIS — B351 Tinea unguium: Secondary | ICD-10-CM | POA: Diagnosis not present

## 2021-10-13 DIAGNOSIS — M79674 Pain in right toe(s): Secondary | ICD-10-CM | POA: Diagnosis not present

## 2021-10-13 DIAGNOSIS — D689 Coagulation defect, unspecified: Secondary | ICD-10-CM

## 2021-10-13 NOTE — Progress Notes (Signed)
This patient returns to my office for at risk foot care.  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  Patient is taking plavix.   This patient is unable to cut nails herself since the patient cannot reach her nails.These nails are painful walking and wearing shoes.  This patient presents for at risk foot care today.  General Appearance  Alert, conversant and in no acute stress.  Vascular  Dorsalis pedis and posterior tibial  pulses are palpable  bilaterally.  Capillary return is within normal limits  bilaterally. Temperature is within normal limits  bilaterally.  Neurologic  Senn-Weinstein monofilament wire test within normal limits  bilaterally. Muscle power within normal limits bilaterally.  Nails Thick disfigured discolored nails with subungual debris  from hallux to fifth toes bilaterally. No evidence of bacterial infection or drainage bilaterally.  Orthopedic  No limitations of motion  feet .  No crepitus or effusions noted.  No bony pathology or digital deformities noted.  Midfoot arthritis  B/L.  Skin  normotropic skin with no porokeratosis noted bilaterally.  No signs of infections or ulcers noted.   Mucoid cyst second toe right.  Onychomycosis  Pain in right toes  Pain in left toes  Consent was obtained for treatment procedures.   Mechanical debridement of nails 1-5  bilaterally performed with a nail nipper.  Filed with dremel without incident.    Return office visit  10 weeks                    Told patient to return for periodic foot care and evaluation due to potential at risk complications.   Drevion Offord DPM  

## 2021-12-22 ENCOUNTER — Ambulatory Visit: Payer: Medicare Other | Admitting: Podiatry

## 2021-12-22 ENCOUNTER — Encounter: Payer: Self-pay | Admitting: Podiatry

## 2021-12-22 DIAGNOSIS — M79674 Pain in right toe(s): Secondary | ICD-10-CM

## 2021-12-22 DIAGNOSIS — B351 Tinea unguium: Secondary | ICD-10-CM | POA: Diagnosis not present

## 2021-12-22 DIAGNOSIS — M79675 Pain in left toe(s): Secondary | ICD-10-CM

## 2021-12-22 DIAGNOSIS — D689 Coagulation defect, unspecified: Secondary | ICD-10-CM

## 2021-12-22 NOTE — Progress Notes (Signed)
This patient returns to my office for at risk foot care.  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  Patient is taking plavix.   This patient is unable to cut nails herself since the patient cannot reach her nails.These nails are painful walking and wearing shoes.  This patient presents for at risk foot care today.  General Appearance  Alert, conversant and in no acute stress.  Vascular  Dorsalis pedis and posterior tibial  pulses are palpable  bilaterally.  Capillary return is within normal limits  bilaterally. Temperature is within normal limits  bilaterally.  Neurologic  Senn-Weinstein monofilament wire test within normal limits  bilaterally. Muscle power within normal limits bilaterally.  Nails Thick disfigured discolored nails with subungual debris  from hallux to fifth toes bilaterally. No evidence of bacterial infection or drainage bilaterally.  Orthopedic  No limitations of motion  feet .  No crepitus or effusions noted.  No bony pathology or digital deformities noted.  Midfoot arthritis  B/L.  Skin  normotropic skin with no porokeratosis noted bilaterally.  No signs of infections or ulcers noted.   Mucoid cyst second toe right.  Onychomycosis  Pain in right toes  Pain in left toes  Consent was obtained for treatment procedures.   Mechanical debridement of nails 1-5  bilaterally performed with a nail nipper.  Filed with dremel without incident.    Return office visit  10 weeks                    Told patient to return for periodic foot care and evaluation due to potential at risk complications.   Elizbeth Posa DPM  

## 2022-03-19 ENCOUNTER — Ambulatory Visit: Payer: Medicare Other | Admitting: Podiatry

## 2022-03-19 ENCOUNTER — Encounter: Payer: Self-pay | Admitting: Podiatry

## 2022-03-19 VITALS — BP 125/66 | HR 70

## 2022-03-19 DIAGNOSIS — B351 Tinea unguium: Secondary | ICD-10-CM | POA: Diagnosis not present

## 2022-03-19 DIAGNOSIS — M79674 Pain in right toe(s): Secondary | ICD-10-CM | POA: Diagnosis not present

## 2022-03-19 DIAGNOSIS — M79675 Pain in left toe(s): Secondary | ICD-10-CM | POA: Diagnosis not present

## 2022-03-19 DIAGNOSIS — D689 Coagulation defect, unspecified: Secondary | ICD-10-CM | POA: Diagnosis not present

## 2022-03-19 NOTE — Progress Notes (Signed)
This patient returns to my office for at risk foot care.  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  Patient is taking plavix.   This patient is unable to cut nails herself since the patient cannot reach her nails.These nails are painful walking and wearing shoes.  This patient presents for at risk foot care today.  General Appearance  Alert, conversant and in no acute stress.  Vascular  Dorsalis pedis and posterior tibial  pulses are palpable  bilaterally.  Capillary return is within normal limits  bilaterally. Temperature is within normal limits  bilaterally.  Neurologic  Senn-Weinstein monofilament wire test within normal limits  bilaterally. Muscle power within normal limits bilaterally.  Nails Thick disfigured discolored nails with subungual debris  from hallux to fifth toes bilaterally. No evidence of bacterial infection or drainage bilaterally.  Orthopedic  No limitations of motion  feet .  No crepitus or effusions noted.  No bony pathology or digital deformities noted.  Midfoot arthritis  B/L.  Skin  normotropic skin with no porokeratosis noted bilaterally.  No signs of infections or ulcers noted.   Mucoid cyst second toe right.  Onychomycosis  Pain in right toes  Pain in left toes  Consent was obtained for treatment procedures.   Mechanical debridement of nails 1-5  bilaterally performed with a nail nipper.  Filed with dremel without incident.    Return office visit  10 weeks                    Told patient to return for periodic foot care and evaluation due to potential at risk complications.   Gardiner Barefoot DPM

## 2022-06-18 ENCOUNTER — Ambulatory Visit: Payer: Medicare Other | Admitting: Podiatry

## 2022-06-18 ENCOUNTER — Encounter: Payer: Self-pay | Admitting: Podiatry

## 2022-06-18 DIAGNOSIS — M79674 Pain in right toe(s): Secondary | ICD-10-CM | POA: Diagnosis not present

## 2022-06-18 DIAGNOSIS — D689 Coagulation defect, unspecified: Secondary | ICD-10-CM

## 2022-06-18 DIAGNOSIS — M79675 Pain in left toe(s): Secondary | ICD-10-CM

## 2022-06-18 DIAGNOSIS — B351 Tinea unguium: Secondary | ICD-10-CM

## 2022-06-18 NOTE — Progress Notes (Signed)
This patient returns to my office for at risk foot care.  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  Patient is taking plavix.   This patient is unable to cut nails herself since the patient cannot reach her nails.These nails are painful walking and wearing shoes.  This patient presents for at risk foot care today.  General Appearance  Alert, conversant and in no acute stress.  Vascular  Dorsalis pedis and posterior tibial  pulses are palpable  bilaterally.  Capillary return is within normal limits  bilaterally. Temperature is within normal limits  bilaterally.  Neurologic  Senn-Weinstein monofilament wire test within normal limits  bilaterally. Muscle power within normal limits bilaterally.  Nails Thick disfigured discolored nails with subungual debris  from hallux to fifth toes bilaterally. No evidence of bacterial infection or drainage bilaterally.  Orthopedic  No limitations of motion  feet .  No crepitus or effusions noted.  No bony pathology or digital deformities noted.  Midfoot arthritis  B/L.  Skin  normotropic skin with no porokeratosis noted bilaterally.  No signs of infections or ulcers noted.   Mucoid cyst second toe right.  Onychomycosis  Pain in right toes  Pain in left toes  Consent was obtained for treatment procedures.   Mechanical debridement of nails 1-5  bilaterally performed with a nail nipper.  Filed with dremel without incident.    Return office visit  10 weeks                    Told patient to return for periodic foot care and evaluation due to potential at risk complications.   Kalisa Girtman DPM  

## 2022-08-27 ENCOUNTER — Encounter: Payer: Self-pay | Admitting: Podiatry

## 2022-08-27 ENCOUNTER — Ambulatory Visit: Payer: Medicare Other | Admitting: Podiatry

## 2022-08-27 VITALS — BP 140/76 | HR 58

## 2022-08-27 DIAGNOSIS — M79674 Pain in right toe(s): Secondary | ICD-10-CM | POA: Diagnosis not present

## 2022-08-27 DIAGNOSIS — M79675 Pain in left toe(s): Secondary | ICD-10-CM

## 2022-08-27 DIAGNOSIS — D689 Coagulation defect, unspecified: Secondary | ICD-10-CM | POA: Diagnosis not present

## 2022-08-27 DIAGNOSIS — B351 Tinea unguium: Secondary | ICD-10-CM | POA: Diagnosis not present

## 2022-08-27 NOTE — Progress Notes (Signed)
  Subjective:  Patient ID: Lisa Porter, female    DOB: [birth year],  MRN: [id number]  Lisa Porter presents to clinic today for at risk foot care with h/o coagulation defect and painful elongated mycotic toenails 1-5 bilaterally which are tender when wearing enclosed shoe gear. Pain is relieved with periodic professional debridement.  Chief Complaint  Patient presents with   Nail Problem    "Check these toenails."   New problem(s): None.   PCP is Creola Corn, MD.  Allergies  Allergen Reactions   Tape Other (See Comments)    Bandaids - causes rash and redness   Other Rash   Sulfa Antibiotics Rash    Review of Systems: Negative except as noted in the HPI.  Objective: No changes noted in today's physical examination. Vitals:   08/27/22 1424  BP: (!) 140/76  Pulse: (!) 21   Lisa Porter is a pleasant 82 y.o. female WD, WN in NAD. AAO x 3.  Vascular Examination: Capillary refill time immediate b/l. Vascular status intact b/l with palpable pedal pulses. Pedal hair present b/l. No pain with calf compression b/l. Skin temperature gradient WNL b/l. No cyanosis or clubbing b/l. No ischemia or gangrene noted b/l.   Neurological Examination: Sensation grossly intact b/l with 10 gram monofilament. Vibratory sensation intact b/l.   Dermatological Examination: Pedal skin with normal turgor, texture and tone b/l.  No open wounds. No interdigital macerations.   Toenails 1-5 b/l thick, discolored, elongated with subungual debris and pain on dorsal palpation.   No hyperkeratotic nor porokeratotic lesions present on today's visit.  Musculoskeletal Examination: Muscle strength 5/5 to all lower extremity muscle groups bilaterally. Limited joint ROM to the plantar midfoot b/l.  Radiographs: None  Assessment/Plan: 1. Pain due to onychomycosis of toenails of both feet   2. Coagulation defect [facility])     -Patient was evaluated and treated. All patient's and/or POA's questions/concerns  answered on today's visit. -Patient to continue soft, supportive shoe gear daily. -Toenails 1-5 b/l were debrided in length and girth with sterile nail nippers and dremel without iatrogenic bleeding.  -Patient/POA to call should there be question/concern in the interim.   Return in about 3 months (around 11/27/2022).  Lisa Porter, DPM

## 2022-11-30 ENCOUNTER — Encounter: Payer: Self-pay | Admitting: Podiatry

## 2022-11-30 ENCOUNTER — Ambulatory Visit: Payer: Medicare Other | Admitting: Podiatry

## 2022-11-30 DIAGNOSIS — M79674 Pain in right toe(s): Secondary | ICD-10-CM

## 2022-11-30 DIAGNOSIS — D689 Coagulation defect, unspecified: Secondary | ICD-10-CM | POA: Diagnosis not present

## 2022-11-30 DIAGNOSIS — M79675 Pain in left toe(s): Secondary | ICD-10-CM | POA: Diagnosis not present

## 2022-11-30 DIAGNOSIS — B351 Tinea unguium: Secondary | ICD-10-CM

## 2022-11-30 DIAGNOSIS — L84 Corns and callosities: Secondary | ICD-10-CM

## 2022-12-05 NOTE — Progress Notes (Signed)
Subjective:  Patient ID: Lisa Porter, female    DOB: 08/19/1940,  MRN: [id number]  Lisa Porter presents to clinic today for: at risk foot care with h/o coagulation defect and callus(es) left foot and painful thick toenails that are difficult to trim. Painful toenails interfere with ambulation. Aggravating factors include wearing enclosed shoe gear. Pain is relieved with periodic professional debridement. Painful calluses are aggravated when weightbearing with and without shoegear. Pain is relieved with periodic professional debridement.   PCP is Creola Corn, MD.  Allergies  Allergen Reactions   Tape Other (See Comments)    Bandaids - causes rash and redness   Other Rash   Sulfa Antibiotics Rash    Review of Systems: Negative except as noted in the HPI.  Objective: No changes noted in today's physical examination. There were no vitals filed for this visit.  Lisa Porter is a pleasant 82 y.o. female in NAD. AAO x 3.  Vascular Examination: Capillary refill time <3 seconds b/l LE. Palpable pedal pulses b/l LE. Digital hair present b/l. No pedal edema b/l. Skin temperature gradient WNL b/l. No varicosities b/l. No ischemia or gangrene noted b/l LE. No cyanosis or clubbing noted b/l LE.Marland Kitchen  Dermatological Examination: Pedal skin with normal turgor, texture and tone b/l. No open wounds. No interdigital macerations b/l. Toenails 1-5 b/l thickened, discolored, dystrophic with subungual debris. There is pain on palpation to dorsal aspect of nailplates. Hyperkeratotic lesion(s) submet head 1 left foot.  No erythema, no edema, no drainage, no fluctuance..  Neurological Examination: Protective sensation intact with 10 gram monofilament b/l LE. Vibratory sensation intact b/l LE.   Musculoskeletal Examination: Muscle strength 5/5 to all lower extremity muscle groups bilaterally. No gross bony deformities bilaterally.  Assessment/Plan: 1. Pain due to onychomycosis of toenails of both feet    2. Callus   3. Coagulation defect (HCC)    -Consent given for treatment as described below: -Examined patient. -Continue supportive shoe gear daily. -Mycotic toenails 1-5 bilaterally were debrided in length and girth with sterile nail nippers and dremel without incident. -Callus(es) submet head 1 left foot pared utilizing sterile scalpel blade without complication or incident. Total number debrided =1. -Patient/POA to call should there be question/concern in the interim.   Return in about 3 months (around 03/01/2023).  Freddie Breech, DPM

## 2022-12-07 ENCOUNTER — Ambulatory Visit: Payer: Medicare Other | Admitting: Podiatry

## 2023-02-08 ENCOUNTER — Encounter: Payer: Self-pay | Admitting: Podiatry

## 2023-02-08 ENCOUNTER — Ambulatory Visit: Payer: Medicare Other | Admitting: Podiatry

## 2023-02-08 VITALS — Ht 61.0 in | Wt 154.0 lb

## 2023-02-08 DIAGNOSIS — M79674 Pain in right toe(s): Secondary | ICD-10-CM

## 2023-02-08 DIAGNOSIS — M79675 Pain in left toe(s): Secondary | ICD-10-CM | POA: Diagnosis not present

## 2023-02-08 DIAGNOSIS — L84 Corns and callosities: Secondary | ICD-10-CM | POA: Diagnosis not present

## 2023-02-08 DIAGNOSIS — B351 Tinea unguium: Secondary | ICD-10-CM | POA: Diagnosis not present

## 2023-02-08 DIAGNOSIS — D689 Coagulation defect, unspecified: Secondary | ICD-10-CM

## 2023-02-13 NOTE — Progress Notes (Signed)
  Subjective:  Patient ID: Lisa Porter, female    DOB: Dec 19, 1940,  MRN: [id number]  82 y.o. female presents to clinic with  at risk foot care with h/o coagulation defect and callus(es) left foot and painful thick toenails that are difficult to trim. Painful toenails interfere with ambulation. Aggravating factors include wearing enclosed shoe gear. Pain is relieved with periodic professional debridement. Painful calluses are aggravated when weightbearing with and without shoegear. Pain is relieved with periodic professional debridement.  Chief Complaint  Patient presents with   Nail Problem    Patient is here for routine foot care   New problem(s): None   PCP is Creola Corn, MD. Theron Arista 01/05/2023.  Allergies  Allergen Reactions   Tape Other (See Comments)    Bandaids - causes rash and redness   Other Rash   Sulfa Antibiotics Rash    Review of Systems: Negative except as noted in the HPI.   Objective:  Lisa Porter is a pleasant 82 y.o. female WD, WN in NAD. AAO x 3.  Vascular Examination: Vascular status intact b/l with palpable pedal pulses. CFT immediate b/l. No edema. No pain with calf compression b/l. Skin temperature gradient WNL b/l.   Neurological Examination: Sensation grossly intact b/l with 10 gram monofilament. Vibratory sensation intact b/l.   Dermatological Examination: Pedal skin with normal turgor, texture and tone b/l. Toenails 1-5 b/l thick, discolored, elongated with subungual debris and pain on dorsal palpation. Hyperkeratotic lesion(s) submet head 1 left foot.  No erythema, no edema, no drainage, no fluctuance.  Musculoskeletal Examination: Muscle strength 5/5 to b/l LE. No pain, crepitus or joint limitation noted with ROM bilateral LE. No gross bony deformities bilaterally.  Radiographs: None  Last A1c:       No data to display           Assessment:   1. Pain due to onychomycosis of toenails of both feet   2. Callus   3. Coagulation defect [facility])     Plan:  -Patient was evaluated today. All questions/concerns addressed on today's visit. -Patient to continue soft, supportive shoe gear daily. -Toenails 1-5 b/l were debrided in length and girth with sterile nail nippers and dremel without iatrogenic bleeding.  -Callus(es) submet head 1 left foot pared utilizing sterile scalpel blade without complication or incident. Total number debrided =1. -Patient/POA to call should there be question/concern in the interim.  Return in about 3 months (around 05/11/2023).  Freddie Breech, DPM      [place] LOCATION: [address], [place] [address]                   Office [phone]   [facility] LOCATION: [address] [place], [place] [address] Office [phone]

## 2023-05-03 ENCOUNTER — Ambulatory Visit: Payer: Medicare Other | Admitting: Podiatry

## 2023-05-03 ENCOUNTER — Encounter: Payer: Self-pay | Admitting: Podiatry

## 2023-05-03 VITALS — Ht 61.0 in | Wt 154.0 lb

## 2023-05-03 DIAGNOSIS — L84 Corns and callosities: Secondary | ICD-10-CM | POA: Diagnosis not present

## 2023-05-03 DIAGNOSIS — D689 Coagulation defect, unspecified: Secondary | ICD-10-CM | POA: Diagnosis not present

## 2023-05-03 DIAGNOSIS — M79675 Pain in left toe(s): Secondary | ICD-10-CM | POA: Diagnosis not present

## 2023-05-03 DIAGNOSIS — B351 Tinea unguium: Secondary | ICD-10-CM | POA: Diagnosis not present

## 2023-05-03 DIAGNOSIS — M79674 Pain in right toe(s): Secondary | ICD-10-CM | POA: Diagnosis not present

## 2023-05-10 ENCOUNTER — Encounter: Payer: Self-pay | Admitting: Podiatry

## 2023-05-10 NOTE — Progress Notes (Signed)
  Subjective:  Patient ID: Lisa Porter, female    DOB: July 06, 1940,  MRN: [id number]  Lisa Porter presents to clinic today for at risk foot care with h/o coagulation defect and callus(es) left lower extremity and painful mycotic toenails that are difficult to trim. Painful toenails interfere with ambulation. Aggravating factors include wearing enclosed shoe gear. Pain is relieved with periodic professional debridement. Painful calluses are aggravated when weightbearing with and without shoegear. Pain is relieved with periodic professional debridement.  Chief Complaint  Patient presents with   Nail Problem    Pt is here for [facility] PCP is Dr Timothy Lasso and LOV was in October.   New problem(s): None.   PCP is Creola Corn, MD.  Allergies  Allergen Reactions   Tape Other (See Comments)    Bandaids - causes rash and redness   Other Rash   Sulfa Antibiotics Rash    Review of Systems: Negative except as noted in the HPI.  Objective: No changes noted in today's physical examination. There were no vitals filed for this visit. Lisa Porter is a pleasant 83 y.o. female WD, WN in NAD. AAO x 3.  Vascular Examination: Capillary refill time immediate b/l. Vascular status intact b/l with palpable pedal pulses. Pedal hair present b/l. No pain with calf compression b/l. Skin temperature gradient WNL b/l. No cyanosis or clubbing b/l. No ischemia or gangrene noted b/l.   Neurological Examination: Sensation grossly intact b/l with 10 gram monofilament. Vibratory sensation intact b/l.   Dermatological Examination: Pedal skin with normal turgor, texture and tone b/l.  No open wounds. No interdigital macerations.   Toenails 1-5 b/l thick, discolored, elongated with subungual debris and pain on dorsal palpation.   Hyperkeratotic lesion(s) submet head 1 left foot.  No erythema, no edema, no drainage, no fluctuance.  Musculoskeletal Examination: Muscle strength 5/5 to all lower extremity muscle groups  bilaterally. No pain, crepitus or joint limitation noted with ROM bilateral LE. No gross bony deformities bilaterally.  Radiographs: None  Assessment/Plan: 1. Pain due to onychomycosis of toenails of both feet   2. Callus   3. Coagulation defect [facility])     Patient was evaluated and treated. All patient's and/or POA's questions/concerns addressed on today's visit. Mycotic toenails 1-5 debrided in length and girth without incident. Callus(es) submet head 1 left foot pared with sharp debridement without incident. Continue soft, supportive shoe gear daily. Report any pedal injuries to medical professional. Call office if there are any questions/concerns. -Patient/POA to call should there be question/concern in the interim.   Return in about 10 weeks (around 07/12/2023).  Freddie Breech, DPM      [place] LOCATION: [address], [place] [address]                   Office [phone]   [facility] LOCATION: [address] [place], [place] [address] Office [phone]

## 2023-07-12 ENCOUNTER — Ambulatory Visit: Payer: Medicare Other | Admitting: Podiatry

## 2023-07-12 VITALS — Ht 61.0 in | Wt 154.0 lb

## 2023-07-12 DIAGNOSIS — L84 Corns and callosities: Secondary | ICD-10-CM | POA: Diagnosis not present

## 2023-07-12 DIAGNOSIS — M79675 Pain in left toe(s): Secondary | ICD-10-CM | POA: Diagnosis not present

## 2023-07-12 DIAGNOSIS — M79674 Pain in right toe(s): Secondary | ICD-10-CM

## 2023-07-12 DIAGNOSIS — D689 Coagulation defect, unspecified: Secondary | ICD-10-CM | POA: Diagnosis not present

## 2023-07-12 DIAGNOSIS — B351 Tinea unguium: Secondary | ICD-10-CM | POA: Diagnosis not present

## 2023-07-17 ENCOUNTER — Encounter: Payer: Self-pay | Admitting: Podiatry

## 2023-07-17 NOTE — Progress Notes (Signed)
  Subjective:  Patient ID: Lisa Porter, female    DOB: 23-Aug-1940,  MRN: [id number]  Lisa Porter presents to clinic today for at risk foot care with h/o coagulation defect and callus(es) left foot and painful mycotic toenails that are difficult to trim. Painful toenails interfere with ambulation. Aggravating factors include wearing enclosed shoe gear. Pain is relieved with periodic professional debridement. Painful calluses are aggravated when weightbearing with and without shoegear. Pain is relieved with periodic professional debridement.  Chief Complaint  Patient presents with   Nail Problem    Pt is here for [facility] PCP is Dr Mamie Searles and LOV was in December.   New problem(s): None.   PCP is Margarete Sharps, MD.  Allergies  Allergen Reactions   Tape Other (See Comments)    Bandaids - causes rash and redness   Other Rash   Sulfa Antibiotics Rash    Review of Systems: Negative except as noted in the HPI.  Objective: No changes noted in today's physical examination. There were no vitals filed for this visit. Lisa Porter is a pleasant 83 y.o. female WD, WN in NAD. AAO x 3.  Vascular Examination: Capillary refill time immediate b/l. Vascular status intact b/l with palpable pedal pulses. Pedal hair present b/l. No pain with calf compression b/l. Skin temperature gradient WNL b/l. No cyanosis or clubbing b/l. No ischemia or gangrene noted b/l.   Neurological Examination: Sensation grossly intact b/l with 10 gram monofilament. Vibratory sensation intact b/l.   Dermatological Examination: Pedal skin with normal turgor, texture and tone b/l.  No open wounds. No interdigital macerations.   Toenails 1-5 b/l thick, discolored, elongated with subungual debris and pain on dorsal palpation.   Hyperkeratotic lesion(s) submet head 1 left foot.  No erythema, no edema, no drainage, no fluctuance.  Musculoskeletal Examination: Muscle strength 5/5 to all lower extremity muscle groups bilaterally. No  pain, crepitus or joint limitation noted with ROM bilateral LE.  Radiographs: None  Assessment/Plan: 1. Pain due to onychomycosis of toenails of both feet   2. Callus   3. Coagulation defect [facility])     Consent given for treatment. Patient examined. All patient's and/or POA's questions/concerns addressed on today's visit.Toenails 1-5 debrided in length and girth without incident. Callus(es) submet head 1 left foot pared with sharp debridement without incident. Continue soft, supportive shoe gear daily. Report any pedal injuries to medical professional. Call office if there are any questions/concerns. -Patient/POA to call should there be question/concern in the interim.   Return in about 3 months (around 10/11/2023).  Lisa Porter, DPM      [place] LOCATION: [address] [place] [address]                   Office [phone]   [facility] LOCATION: [address] [place], [place] [address] Office [phone]

## 2023-09-24 ENCOUNTER — Ambulatory Visit: Admitting: Podiatry

## 2023-09-24 ENCOUNTER — Encounter: Payer: Self-pay | Admitting: Podiatry

## 2023-09-24 VITALS — Ht 61.0 in | Wt 154.0 lb

## 2023-09-24 DIAGNOSIS — B351 Tinea unguium: Secondary | ICD-10-CM

## 2023-09-24 DIAGNOSIS — M79675 Pain in left toe(s): Secondary | ICD-10-CM | POA: Diagnosis not present

## 2023-09-24 DIAGNOSIS — D689 Coagulation defect, unspecified: Secondary | ICD-10-CM | POA: Diagnosis not present

## 2023-09-24 DIAGNOSIS — M79674 Pain in right toe(s): Secondary | ICD-10-CM | POA: Diagnosis not present

## 2023-09-24 DIAGNOSIS — L84 Corns and callosities: Secondary | ICD-10-CM

## 2023-09-30 NOTE — Progress Notes (Signed)
  Subjective:  Patient ID: Lisa Porter, female    DOB: 07-20-1940,  MRN: [id number]  Lisa Porter presents to clinic today for at risk foot care with h/o coagulation defect and callus(es) left lower extremity and painful thick toenails that are difficult to trim. Painful toenails interfere with ambulation. Aggravating factors include wearing enclosed shoe gear. Pain is relieved with periodic professional debridement. Painful calluses are aggravated when weightbearing with and without shoegear. Pain is relieved with periodic professional debridement.  Chief Complaint  Patient presents with   Nail Problem    Pt is here for [facility] PCP is Dr Onita and LOV was April.   New problem(s): None.   PCP is Onita Rush, MD.  Allergies  Allergen Reactions   Tape Other (See Comments)    Bandaids - causes rash and redness   Other Rash   Sulfa Antibiotics Rash    Review of Systems: Negative except as noted in the HPI.  Objective: No changes noted in today's physical examination. There were no vitals filed for this visit. Lisa Porter is a pleasant 83 y.o. female WD, WN in NAD. AAO x 3.  Vascular Examination: Capillary refill time immediate b/l. Palpable pedal pulses. Pedal hair present b/l. No pain with calf compression b/l. Skin temperature gradient WNL b/l. No cyanosis or clubbing b/l. No ischemia or gangrene noted b/l. No edema noted b/l LE.  Neurological Examination: Sensation grossly intact b/l with 10 gram monofilament. Vibratory sensation intact b/l.   Dermatological Examination: Pedal skin with normal turgor, texture and tone b/l.  No open wounds. No interdigital macerations.   Toenails 1-5 b/l thick, discolored, elongated with subungual debris and pain on dorsal palpation.   Hyperkeratotic lesion(s) submet head 1 left foot.  No erythema, no edema, no drainage, no fluctuance.  Musculoskeletal Examination: Muscle strength 5/5 to all lower extremity muscle groups bilaterally. No pain,  crepitus or joint limitation noted with ROM bilateral LE. No gross bony deformities bilaterally. Utilizes cane for ambulation assistance.  Radiographs: None  Assessment/Plan: 1. Pain due to onychomycosis of toenails of both feet   2. 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 All patient's and/or POA's questions/concerns addressed on today's visit. Mycotic toenails 1-5 debrided in length and girth without incident. Callus(es) submet head 1 left foot pared with sharp debridement without incident. Continue soft, supportive shoe gear daily. Report any pedal injuries to medical professional. Call office if there are any questions/concerns. -Patient/POA to call should there be question/concern in the interim.   No follow-ups on file.  Delon LITTIE Merlin, DPM      [place] LOCATION: [address] [place] [address]                   Office [phone]   [facility] LOCATION: [address] [place], [place] [address] Office [phone]

## 2023-10-11 ENCOUNTER — Ambulatory Visit: Admitting: Podiatry

## 2023-12-02 ENCOUNTER — Ambulatory Visit: Admitting: Podiatry

## 2023-12-02 ENCOUNTER — Encounter: Payer: Self-pay | Admitting: Podiatry

## 2023-12-02 DIAGNOSIS — L84 Corns and callosities: Secondary | ICD-10-CM | POA: Diagnosis not present

## 2023-12-02 DIAGNOSIS — D689 Coagulation defect, unspecified: Secondary | ICD-10-CM

## 2023-12-02 DIAGNOSIS — M79674 Pain in right toe(s): Secondary | ICD-10-CM | POA: Diagnosis not present

## 2023-12-02 DIAGNOSIS — M79675 Pain in left toe(s): Secondary | ICD-10-CM | POA: Diagnosis not present

## 2023-12-02 DIAGNOSIS — B351 Tinea unguium: Secondary | ICD-10-CM | POA: Diagnosis not present

## 2023-12-02 NOTE — Progress Notes (Signed)
  Subjective:  Patient ID: Lisa Porter, female    DOB: 01-18-1941,  MRN: [id number]  Lisa Porter presents to clinic today for at risk foot care with h/o coagulation defect and callus(es) left foot and painful mycotic toenails that are difficult to trim. Painful toenails interfere with ambulation. Aggravating factors include wearing enclosed shoe gear. Pain is relieved with periodic professional debridement. Painful calluses are aggravated when weightbearing with and without shoegear. Pain is relieved with periodic professional debridement.   Patient states she purchased [employer] shoes, but fell at church after her shoe got caught on carpet. Chief Complaint  Patient presents with   RFC    Rm2 Routine foot care/ Dr. Norleen Jungling    PCP is Jungling Norleen, MD.  Allergies  Allergen Reactions   Tape Other (See Comments)    Bandaids - causes rash and redness   Other Rash   Sulfa Antibiotics Rash    Review of Systems: Negative except as noted in the HPI.  Objective: No changes noted in today's physical examination. There were no vitals filed for this visit. Lisa Porter is a pleasant 83 y.o. female WD, WN in NAD. AAO x 3.  Vascular Examination: Capillary refill time immediate b/l. Palpable pedal pulses. Pedal hair present b/l. No pain with calf compression b/l. Skin temperature gradient WNL b/l. No cyanosis or clubbing b/l. No ischemia or gangrene noted b/l. No edema noted b/l LE.  Neurological Examination: Sensation grossly intact b/l with 10 gram monofilament. Vibratory sensation intact b/l.   Dermatological Examination: Pedal skin with normal turgor, texture and tone b/l.  No open wounds. No interdigital macerations.   Toenails 1-5 b/l thick, discolored, elongated with subungual debris and pain on dorsal palpation.   Hyperkeratotic lesion(s) submet head 1 left foot.  No erythema, no edema, no drainage, no fluctuance.  Musculoskeletal Examination: Muscle strength 5/5 to all lower  extremity muscle groups bilaterally. No pain, crepitus or joint limitation noted with ROM bilateral LE. No gross bony deformities bilaterally. Utilizes cane for ambulation assistance.  Radiographs: None  Assessment/Plan: 1. Pain due to onychomycosis of toenails of both feet   2. Callus   3. Coagulation defect (HCC)    -Consent given for treatment as described below: -Examined patient. -Patient to continue soft, supportive shoe gear daily. -Toenails 1-5 b/l were debrided in length and girth with sterile nail nippers and dremel without iatrogenic bleeding.  -Shoe recommendations given for [employer] and [employer]. -Patient/POA to call should there be question/concern in the interim.   Return in about 3 months (around 03/02/2024).  Delon LITTIE Merlin, DPM      [place] LOCATION: [address], [place] [address]                   Office [phone]   [facility] LOCATION: [address] [place], [place] [address] Office [phone]

## 2024-03-02 ENCOUNTER — Ambulatory Visit: Admitting: Podiatry

## 2024-03-02 DIAGNOSIS — D689 Coagulation defect, unspecified: Secondary | ICD-10-CM | POA: Diagnosis not present

## 2024-03-02 DIAGNOSIS — B351 Tinea unguium: Secondary | ICD-10-CM

## 2024-03-02 DIAGNOSIS — M79674 Pain in right toe(s): Secondary | ICD-10-CM | POA: Diagnosis not present

## 2024-03-02 DIAGNOSIS — M79675 Pain in left toe(s): Secondary | ICD-10-CM

## 2024-03-02 DIAGNOSIS — Q828 Other specified congenital malformations of skin: Secondary | ICD-10-CM

## 2024-03-06 ENCOUNTER — Encounter: Payer: Self-pay | Admitting: Podiatry

## 2024-03-06 NOTE — Progress Notes (Signed)
"  °  Subjective:  Patient ID: Lisa Porter, female    DOB: 1940-05-15,  MRN: [id number]  [place] HERO Slomski presents to clinic today for at risk foot care with h/o coagulation defect and painful porokeratotic lesion(s) left foot and painful mycotic toenails that limit ambulation. Painful toenails interfere with ambulation. Aggravating factors include wearing enclosed shoe gear. Pain is relieved with periodic professional debridement. Painful porokeratotic lesions are aggravated when weightbearing with and without shoegear. Pain is relieved with periodic professional debridement.  Chief Complaint  Patient presents with   Nail Problem    Thick painful toenails, 3 month follow up    New problem(s): None.   PCP is Onita Rush, MD. ARNETTA 01/03/24.  Allergies[1]  Review of Systems: Negative except as noted in the HPI.  Objective: No changes noted in today's physical examination. There were no vitals filed for this visit. Lisa Porter is a pleasant 83 y.o. female WD, WN in NAD. AAO x 3.  Vascular Examination: Capillary refill time immediate b/l. Palpable pedal pulses. Pedal hair present b/l. No pain with calf compression b/l. Skin temperature gradient WNL b/l. No cyanosis or clubbing b/l. No ischemia or gangrene noted b/l. No edema noted b/l LE.  Neurological Examination: Sensation grossly intact b/l with 10 gram monofilament. Vibratory sensation intact b/l.   Dermatological Examination: Pedal skin with normal turgor, texture and tone b/l.  No open wounds. No interdigital macerations.   Toenails 1-5 b/l thick, discolored, elongated with subungual debris and pain on dorsal palpation.   Porokeratotic lesion(s) submet head 1 left foot.  No erythema, no edema, no drainage, no fluctuance.  Musculoskeletal Examination: Muscle strength 5/5 to all lower extremity muscle groups bilaterally. No pain, crepitus or joint limitation noted with ROM bilateral LE. No gross bony deformities bilaterally. Utilizes  cane for ambulation assistance.  Radiographs: None  Assessment/Plan: 1. Pain due to onychomycosis of toenails of both feet   2. Porokeratosis   3. Coagulation defect     Patient was evaluated and treated. All patient's and/or POA's questions/concerns addressed on today's visit. Mycotic toenails 1-5 b/l debrided in length and girth without incident. Porokeratotic lesion(s) submet head 1 left foot pared with sharp debridement without incident. Continue soft, supportive shoe gear daily. Report any pedal injuries to medical professional. Call office if there are any questions/concerns. -Patient/POA to call should there be question/concern in the interim.   Return in about 3 months (around 05/31/2024).  Delon LITTIE Merlin, DPM      [place] LOCATION: [address], [place] [address]                   Office [phone]   [facility] LOCATION: [address] [place], [place] [address] Office [phone]     [1]  Allergies Allergen Reactions   Tape Other (See Comments)    Bandaids - causes rash and redness   Other Rash   Sulfa Antibiotics Rash   "

## 2024-06-01 ENCOUNTER — Ambulatory Visit: Admitting: Podiatry
